# Patient Record
Sex: Female | Born: 1960 | Race: White | Hispanic: No | Marital: Married | Smoking: Heavy tobacco smoker
Health system: Southern US, Community
[De-identification: ages and names within clinical notes are randomized; demographics above are authoritative.]

## PROBLEM LIST (undated history)

## (undated) DIAGNOSIS — T7840XA Allergy, unspecified, initial encounter: Secondary | ICD-10-CM

## (undated) DIAGNOSIS — R51 Headache: Secondary | ICD-10-CM

## (undated) DIAGNOSIS — E785 Hyperlipidemia, unspecified: Secondary | ICD-10-CM

## (undated) DIAGNOSIS — I10 Essential (primary) hypertension: Secondary | ICD-10-CM

## (undated) DIAGNOSIS — R519 Headache, unspecified: Secondary | ICD-10-CM

## (undated) DIAGNOSIS — M1990 Unspecified osteoarthritis, unspecified site: Secondary | ICD-10-CM

## (undated) DIAGNOSIS — J180 Bronchopneumonia, unspecified organism: Secondary | ICD-10-CM

## (undated) HISTORY — PX: ABDOMINAL HYSTERECTOMY: SHX81

## (undated) HISTORY — DX: Bronchopneumonia, unspecified organism: J18.0

## (undated) HISTORY — PX: TONSILLECTOMY AND ADENOIDECTOMY: SUR1326

## (undated) HISTORY — PX: KNEE SURGERY: SHX244

## (undated) HISTORY — PX: JOINT REPLACEMENT: SHX530

## (undated) HISTORY — PX: TONSILLECTOMY: SUR1361

## (undated) HISTORY — DX: Allergy, unspecified, initial encounter: T78.40XA

## (undated) HISTORY — DX: Unspecified osteoarthritis, unspecified site: M19.90

## (undated) HISTORY — DX: Essential (primary) hypertension: I10

## (undated) HISTORY — DX: Hyperlipidemia, unspecified: E78.5

---

## 1998-04-30 ENCOUNTER — Emergency Department (HOSPITAL_COMMUNITY): Admission: EM | Admit: 1998-04-30 | Discharge: 1998-04-30 | Payer: Self-pay | Admitting: *Deleted

## 2004-01-21 ENCOUNTER — Ambulatory Visit: Payer: Self-pay | Admitting: Pain Medicine

## 2004-01-25 ENCOUNTER — Ambulatory Visit: Payer: Self-pay | Admitting: Pain Medicine

## 2004-02-16 ENCOUNTER — Ambulatory Visit: Payer: Self-pay | Admitting: Pain Medicine

## 2004-02-22 ENCOUNTER — Ambulatory Visit: Payer: Self-pay | Admitting: Pain Medicine

## 2004-02-29 ENCOUNTER — Ambulatory Visit: Payer: Self-pay | Admitting: Family Medicine

## 2004-03-15 ENCOUNTER — Ambulatory Visit: Payer: Self-pay | Admitting: Pain Medicine

## 2004-03-30 ENCOUNTER — Ambulatory Visit: Payer: Self-pay | Admitting: Pain Medicine

## 2004-04-12 ENCOUNTER — Ambulatory Visit: Payer: Self-pay | Admitting: Pain Medicine

## 2004-04-25 ENCOUNTER — Ambulatory Visit: Payer: Self-pay | Admitting: Pain Medicine

## 2004-05-31 ENCOUNTER — Ambulatory Visit: Payer: Self-pay | Admitting: Pain Medicine

## 2004-06-06 ENCOUNTER — Ambulatory Visit: Payer: Self-pay | Admitting: Pain Medicine

## 2004-07-04 ENCOUNTER — Ambulatory Visit: Payer: Self-pay | Admitting: Pain Medicine

## 2004-07-20 ENCOUNTER — Ambulatory Visit: Payer: Self-pay | Admitting: Pain Medicine

## 2004-08-30 ENCOUNTER — Ambulatory Visit: Payer: Self-pay | Admitting: Pain Medicine

## 2004-09-05 ENCOUNTER — Ambulatory Visit: Payer: Self-pay | Admitting: Pain Medicine

## 2004-09-27 ENCOUNTER — Ambulatory Visit: Payer: Self-pay | Admitting: Pain Medicine

## 2004-10-05 ENCOUNTER — Ambulatory Visit: Payer: Self-pay | Admitting: Pain Medicine

## 2004-11-10 ENCOUNTER — Ambulatory Visit: Payer: Self-pay | Admitting: Pain Medicine

## 2004-11-14 ENCOUNTER — Ambulatory Visit: Payer: Self-pay | Admitting: Pain Medicine

## 2005-01-05 ENCOUNTER — Ambulatory Visit: Payer: Self-pay | Admitting: Pain Medicine

## 2005-01-11 ENCOUNTER — Ambulatory Visit: Payer: Self-pay | Admitting: Pain Medicine

## 2005-02-02 ENCOUNTER — Ambulatory Visit: Payer: Self-pay | Admitting: Pain Medicine

## 2005-02-08 ENCOUNTER — Ambulatory Visit: Payer: Self-pay | Admitting: Pain Medicine

## 2005-02-28 ENCOUNTER — Ambulatory Visit: Payer: Self-pay | Admitting: Pain Medicine

## 2005-03-06 ENCOUNTER — Ambulatory Visit: Payer: Self-pay | Admitting: Pain Medicine

## 2005-04-27 ENCOUNTER — Ambulatory Visit: Payer: Self-pay | Admitting: Pain Medicine

## 2005-05-03 ENCOUNTER — Ambulatory Visit: Payer: Self-pay | Admitting: Pain Medicine

## 2005-05-23 ENCOUNTER — Ambulatory Visit: Payer: Self-pay | Admitting: Pain Medicine

## 2005-05-29 ENCOUNTER — Ambulatory Visit: Payer: Self-pay | Admitting: Pain Medicine

## 2005-06-20 ENCOUNTER — Ambulatory Visit: Payer: Self-pay | Admitting: Pain Medicine

## 2005-07-06 ENCOUNTER — Ambulatory Visit: Payer: Self-pay | Admitting: Pain Medicine

## 2005-07-24 ENCOUNTER — Ambulatory Visit: Payer: Self-pay | Admitting: Pain Medicine

## 2005-08-17 ENCOUNTER — Ambulatory Visit: Payer: Self-pay | Admitting: Pain Medicine

## 2005-09-25 ENCOUNTER — Ambulatory Visit: Payer: Self-pay | Admitting: Pain Medicine

## 2005-10-24 ENCOUNTER — Ambulatory Visit: Payer: Self-pay | Admitting: Pain Medicine

## 2005-11-01 ENCOUNTER — Ambulatory Visit: Payer: Self-pay | Admitting: Pain Medicine

## 2005-11-21 ENCOUNTER — Ambulatory Visit: Payer: Self-pay | Admitting: Pain Medicine

## 2005-11-27 ENCOUNTER — Ambulatory Visit: Payer: Self-pay | Admitting: Pain Medicine

## 2005-12-07 ENCOUNTER — Ambulatory Visit: Payer: Self-pay | Admitting: Internal Medicine

## 2005-12-18 ENCOUNTER — Ambulatory Visit: Payer: Self-pay | Admitting: Physician Assistant

## 2005-12-21 ENCOUNTER — Ambulatory Visit: Payer: Self-pay | Admitting: Pain Medicine

## 2006-01-05 ENCOUNTER — Ambulatory Visit: Payer: Self-pay | Admitting: Unknown Physician Specialty

## 2006-01-22 ENCOUNTER — Ambulatory Visit: Payer: Self-pay | Admitting: Pain Medicine

## 2006-02-14 ENCOUNTER — Ambulatory Visit: Payer: Self-pay | Admitting: Internal Medicine

## 2006-02-20 ENCOUNTER — Ambulatory Visit: Payer: Self-pay | Admitting: Pain Medicine

## 2006-03-07 ENCOUNTER — Ambulatory Visit: Payer: Self-pay | Admitting: Pain Medicine

## 2006-04-17 ENCOUNTER — Ambulatory Visit: Payer: Self-pay | Admitting: Pain Medicine

## 2006-04-30 ENCOUNTER — Ambulatory Visit: Payer: Self-pay | Admitting: Pain Medicine

## 2006-05-15 ENCOUNTER — Ambulatory Visit: Payer: Self-pay | Admitting: Pain Medicine

## 2006-07-24 ENCOUNTER — Ambulatory Visit: Payer: Self-pay | Admitting: Pain Medicine

## 2006-07-30 ENCOUNTER — Ambulatory Visit: Payer: Self-pay | Admitting: Pain Medicine

## 2006-08-23 ENCOUNTER — Ambulatory Visit: Payer: Self-pay | Admitting: Pain Medicine

## 2006-09-22 ENCOUNTER — Ambulatory Visit: Payer: Self-pay | Admitting: Unknown Physician Specialty

## 2006-09-25 ENCOUNTER — Ambulatory Visit: Payer: Self-pay | Admitting: Pain Medicine

## 2006-10-22 ENCOUNTER — Ambulatory Visit: Payer: Self-pay | Admitting: Pain Medicine

## 2006-10-31 ENCOUNTER — Ambulatory Visit: Payer: Self-pay | Admitting: Unknown Physician Specialty

## 2006-11-01 ENCOUNTER — Ambulatory Visit: Payer: Self-pay | Admitting: Unknown Physician Specialty

## 2006-12-04 ENCOUNTER — Ambulatory Visit: Payer: Self-pay | Admitting: Pain Medicine

## 2007-01-02 ENCOUNTER — Ambulatory Visit: Payer: Self-pay | Admitting: Pain Medicine

## 2007-01-31 ENCOUNTER — Ambulatory Visit: Payer: Self-pay | Admitting: Pain Medicine

## 2007-02-06 ENCOUNTER — Ambulatory Visit: Payer: Self-pay | Admitting: Pain Medicine

## 2007-03-05 ENCOUNTER — Ambulatory Visit: Payer: Self-pay | Admitting: Pain Medicine

## 2007-03-18 ENCOUNTER — Ambulatory Visit: Payer: Self-pay | Admitting: Pain Medicine

## 2007-03-26 ENCOUNTER — Ambulatory Visit: Payer: Self-pay | Admitting: Pain Medicine

## 2007-04-30 ENCOUNTER — Ambulatory Visit: Payer: Self-pay | Admitting: Pain Medicine

## 2007-05-06 ENCOUNTER — Ambulatory Visit: Payer: Self-pay | Admitting: Pain Medicine

## 2007-05-30 ENCOUNTER — Ambulatory Visit: Payer: Self-pay | Admitting: Pain Medicine

## 2007-06-05 ENCOUNTER — Ambulatory Visit: Payer: Self-pay | Admitting: Pain Medicine

## 2007-06-27 ENCOUNTER — Ambulatory Visit: Payer: Self-pay | Admitting: Pain Medicine

## 2007-07-15 ENCOUNTER — Ambulatory Visit: Payer: Self-pay | Admitting: Pain Medicine

## 2007-09-03 ENCOUNTER — Ambulatory Visit: Payer: Self-pay | Admitting: Pain Medicine

## 2007-09-09 ENCOUNTER — Ambulatory Visit: Payer: Self-pay | Admitting: Pain Medicine

## 2007-09-17 ENCOUNTER — Ambulatory Visit: Payer: Self-pay | Admitting: Internal Medicine

## 2007-10-01 ENCOUNTER — Ambulatory Visit: Payer: Self-pay | Admitting: Pain Medicine

## 2007-10-07 ENCOUNTER — Ambulatory Visit: Payer: Self-pay | Admitting: Pain Medicine

## 2007-10-31 ENCOUNTER — Ambulatory Visit: Payer: Self-pay | Admitting: Pain Medicine

## 2007-11-11 ENCOUNTER — Ambulatory Visit: Payer: Self-pay | Admitting: Pain Medicine

## 2007-12-03 ENCOUNTER — Ambulatory Visit: Payer: Self-pay | Admitting: Pain Medicine

## 2007-12-23 ENCOUNTER — Ambulatory Visit: Payer: Self-pay | Admitting: Pain Medicine

## 2008-01-03 ENCOUNTER — Ambulatory Visit: Payer: Self-pay | Admitting: Internal Medicine

## 2008-01-28 ENCOUNTER — Ambulatory Visit: Payer: Self-pay | Admitting: Pain Medicine

## 2008-01-29 ENCOUNTER — Ambulatory Visit: Payer: Self-pay | Admitting: Pain Medicine

## 2008-02-18 ENCOUNTER — Ambulatory Visit: Payer: Self-pay | Admitting: Pain Medicine

## 2008-02-24 ENCOUNTER — Ambulatory Visit: Payer: Self-pay | Admitting: Pain Medicine

## 2008-03-31 ENCOUNTER — Ambulatory Visit: Payer: Self-pay | Admitting: Pain Medicine

## 2008-04-20 ENCOUNTER — Ambulatory Visit: Payer: Self-pay | Admitting: General Surgery

## 2008-04-27 ENCOUNTER — Ambulatory Visit: Payer: Self-pay | Admitting: Pain Medicine

## 2008-05-26 ENCOUNTER — Ambulatory Visit: Payer: Self-pay | Admitting: Pain Medicine

## 2008-06-03 ENCOUNTER — Ambulatory Visit: Payer: Self-pay | Admitting: Pain Medicine

## 2008-06-16 ENCOUNTER — Ambulatory Visit: Payer: Self-pay | Admitting: Internal Medicine

## 2008-06-22 ENCOUNTER — Ambulatory Visit: Payer: Self-pay | Admitting: Pain Medicine

## 2008-07-29 ENCOUNTER — Ambulatory Visit: Payer: Self-pay | Admitting: Pain Medicine

## 2008-08-24 ENCOUNTER — Ambulatory Visit: Payer: Self-pay | Admitting: Pain Medicine

## 2008-09-17 ENCOUNTER — Ambulatory Visit: Payer: Self-pay | Admitting: Pain Medicine

## 2008-10-20 ENCOUNTER — Ambulatory Visit: Payer: Self-pay | Admitting: Pain Medicine

## 2008-10-28 ENCOUNTER — Ambulatory Visit: Payer: Self-pay | Admitting: Pain Medicine

## 2008-11-18 ENCOUNTER — Ambulatory Visit: Payer: Self-pay | Admitting: Pain Medicine

## 2008-12-29 ENCOUNTER — Ambulatory Visit: Payer: Self-pay | Admitting: Pain Medicine

## 2009-01-04 ENCOUNTER — Ambulatory Visit: Payer: Self-pay | Admitting: Pain Medicine

## 2009-01-27 ENCOUNTER — Ambulatory Visit: Payer: Self-pay | Admitting: Pain Medicine

## 2009-02-22 ENCOUNTER — Ambulatory Visit: Payer: Self-pay | Admitting: Pain Medicine

## 2009-04-01 ENCOUNTER — Ambulatory Visit: Payer: Self-pay | Admitting: Pain Medicine

## 2009-05-11 ENCOUNTER — Ambulatory Visit: Payer: Self-pay | Admitting: Pain Medicine

## 2009-06-10 ENCOUNTER — Ambulatory Visit: Payer: Self-pay | Admitting: Pain Medicine

## 2009-06-21 ENCOUNTER — Ambulatory Visit: Payer: Self-pay | Admitting: Pain Medicine

## 2009-07-15 ENCOUNTER — Ambulatory Visit: Payer: Self-pay | Admitting: Pain Medicine

## 2009-08-19 ENCOUNTER — Ambulatory Visit: Payer: Self-pay | Admitting: Internal Medicine

## 2009-08-24 ENCOUNTER — Ambulatory Visit: Payer: Self-pay | Admitting: Pain Medicine

## 2009-09-13 ENCOUNTER — Ambulatory Visit: Payer: Self-pay | Admitting: Pain Medicine

## 2009-10-12 ENCOUNTER — Ambulatory Visit: Payer: Self-pay | Admitting: Pain Medicine

## 2009-10-20 ENCOUNTER — Ambulatory Visit: Payer: Self-pay | Admitting: Pain Medicine

## 2009-11-08 ENCOUNTER — Ambulatory Visit: Payer: Self-pay | Admitting: Pain Medicine

## 2009-11-22 ENCOUNTER — Ambulatory Visit: Payer: Self-pay | Admitting: Pain Medicine

## 2009-12-14 ENCOUNTER — Ambulatory Visit: Payer: Self-pay | Admitting: Pain Medicine

## 2010-01-18 ENCOUNTER — Ambulatory Visit: Payer: Self-pay | Admitting: Pain Medicine

## 2010-01-24 ENCOUNTER — Ambulatory Visit: Payer: Self-pay | Admitting: Pain Medicine

## 2010-02-10 ENCOUNTER — Ambulatory Visit: Payer: Self-pay | Admitting: Pain Medicine

## 2010-03-14 ENCOUNTER — Ambulatory Visit: Payer: Self-pay | Admitting: Pain Medicine

## 2010-03-21 ENCOUNTER — Ambulatory Visit: Payer: Self-pay | Admitting: Pain Medicine

## 2010-04-12 ENCOUNTER — Ambulatory Visit: Payer: Self-pay | Admitting: Pain Medicine

## 2010-05-12 ENCOUNTER — Ambulatory Visit: Payer: Self-pay | Admitting: Pain Medicine

## 2010-05-25 ENCOUNTER — Ambulatory Visit: Payer: Self-pay | Admitting: Pain Medicine

## 2010-06-09 ENCOUNTER — Ambulatory Visit: Payer: Self-pay | Admitting: Pain Medicine

## 2010-07-07 ENCOUNTER — Ambulatory Visit: Payer: Self-pay | Admitting: Pain Medicine

## 2010-08-15 ENCOUNTER — Ambulatory Visit: Payer: Self-pay | Admitting: Pain Medicine

## 2010-09-13 ENCOUNTER — Ambulatory Visit: Payer: Self-pay | Admitting: Pain Medicine

## 2010-10-17 ENCOUNTER — Ambulatory Visit: Payer: Self-pay | Admitting: Pain Medicine

## 2010-11-15 ENCOUNTER — Ambulatory Visit: Payer: Self-pay | Admitting: Pain Medicine

## 2010-12-01 ENCOUNTER — Ambulatory Visit: Payer: Self-pay | Admitting: Internal Medicine

## 2010-12-13 ENCOUNTER — Ambulatory Visit: Payer: Self-pay | Admitting: Pain Medicine

## 2010-12-28 ENCOUNTER — Ambulatory Visit: Payer: Self-pay | Admitting: Pain Medicine

## 2011-01-17 ENCOUNTER — Ambulatory Visit: Payer: Self-pay | Admitting: Pain Medicine

## 2011-02-14 ENCOUNTER — Ambulatory Visit: Payer: Self-pay | Admitting: Pain Medicine

## 2011-03-13 ENCOUNTER — Ambulatory Visit: Payer: Self-pay | Admitting: Pain Medicine

## 2011-04-13 ENCOUNTER — Ambulatory Visit: Payer: Self-pay | Admitting: Pain Medicine

## 2011-04-26 ENCOUNTER — Ambulatory Visit: Payer: Self-pay | Admitting: Pain Medicine

## 2011-05-16 ENCOUNTER — Ambulatory Visit: Payer: Self-pay | Admitting: Pain Medicine

## 2011-06-13 ENCOUNTER — Ambulatory Visit: Payer: Self-pay | Admitting: Pain Medicine

## 2011-07-10 ENCOUNTER — Ambulatory Visit: Payer: Self-pay | Admitting: Pain Medicine

## 2011-08-10 ENCOUNTER — Ambulatory Visit: Payer: Self-pay | Admitting: Pain Medicine

## 2011-08-30 ENCOUNTER — Ambulatory Visit: Payer: Self-pay | Admitting: Pain Medicine

## 2011-09-12 ENCOUNTER — Ambulatory Visit: Payer: Self-pay | Admitting: Pain Medicine

## 2011-10-12 ENCOUNTER — Ambulatory Visit: Payer: Self-pay | Admitting: Pain Medicine

## 2011-11-01 ENCOUNTER — Ambulatory Visit: Payer: Self-pay | Admitting: Pain Medicine

## 2011-12-12 ENCOUNTER — Ambulatory Visit: Payer: Self-pay | Admitting: Pain Medicine

## 2012-01-11 ENCOUNTER — Ambulatory Visit: Payer: Self-pay | Admitting: Pain Medicine

## 2012-01-17 ENCOUNTER — Ambulatory Visit: Payer: Self-pay | Admitting: Pain Medicine

## 2012-02-08 ENCOUNTER — Ambulatory Visit: Payer: Self-pay | Admitting: Pain Medicine

## 2012-02-14 ENCOUNTER — Ambulatory Visit: Payer: Self-pay | Admitting: Pain Medicine

## 2012-03-12 ENCOUNTER — Ambulatory Visit: Payer: Self-pay | Admitting: Pain Medicine

## 2012-03-20 ENCOUNTER — Ambulatory Visit: Payer: Self-pay | Admitting: Pain Medicine

## 2012-04-11 ENCOUNTER — Ambulatory Visit: Payer: Self-pay | Admitting: Pain Medicine

## 2012-05-14 ENCOUNTER — Ambulatory Visit: Payer: Self-pay | Admitting: Pain Medicine

## 2012-05-22 ENCOUNTER — Ambulatory Visit: Payer: Self-pay | Admitting: Pain Medicine

## 2012-06-11 ENCOUNTER — Ambulatory Visit: Payer: Self-pay | Admitting: Pain Medicine

## 2012-07-11 ENCOUNTER — Ambulatory Visit: Payer: Self-pay | Admitting: Pain Medicine

## 2012-07-22 ENCOUNTER — Ambulatory Visit: Payer: Self-pay | Admitting: Pain Medicine

## 2012-08-12 ENCOUNTER — Ambulatory Visit: Payer: Self-pay | Admitting: Pain Medicine

## 2012-09-09 ENCOUNTER — Ambulatory Visit: Payer: Self-pay | Admitting: Pain Medicine

## 2012-10-10 ENCOUNTER — Ambulatory Visit: Payer: Self-pay | Admitting: Pain Medicine

## 2012-11-06 ENCOUNTER — Ambulatory Visit: Payer: Self-pay | Admitting: Pain Medicine

## 2012-12-05 ENCOUNTER — Ambulatory Visit: Payer: Self-pay | Admitting: Pain Medicine

## 2013-01-06 ENCOUNTER — Ambulatory Visit: Payer: Self-pay | Admitting: Pain Medicine

## 2013-02-04 ENCOUNTER — Ambulatory Visit: Payer: Self-pay | Admitting: Pain Medicine

## 2013-03-05 ENCOUNTER — Ambulatory Visit: Payer: Self-pay | Admitting: Pain Medicine

## 2013-04-01 ENCOUNTER — Ambulatory Visit: Payer: Self-pay | Admitting: Family

## 2013-04-08 ENCOUNTER — Ambulatory Visit: Payer: Self-pay | Admitting: Pain Medicine

## 2013-05-07 ENCOUNTER — Ambulatory Visit: Payer: Self-pay | Admitting: Pain Medicine

## 2013-06-05 ENCOUNTER — Ambulatory Visit: Payer: Self-pay | Admitting: Pain Medicine

## 2013-06-09 ENCOUNTER — Ambulatory Visit: Payer: Self-pay | Admitting: Pain Medicine

## 2013-07-08 ENCOUNTER — Ambulatory Visit: Payer: Self-pay | Admitting: Pain Medicine

## 2013-08-07 ENCOUNTER — Ambulatory Visit: Payer: Self-pay | Admitting: Pain Medicine

## 2013-09-03 ENCOUNTER — Ambulatory Visit: Payer: Self-pay | Admitting: Pain Medicine

## 2013-10-01 ENCOUNTER — Ambulatory Visit: Payer: Self-pay | Admitting: Pain Medicine

## 2013-11-05 ENCOUNTER — Ambulatory Visit: Payer: Self-pay | Admitting: Pain Medicine

## 2013-12-02 ENCOUNTER — Ambulatory Visit: Payer: Self-pay | Admitting: Pain Medicine

## 2013-12-22 ENCOUNTER — Ambulatory Visit: Payer: Self-pay | Admitting: Pain Medicine

## 2013-12-26 ENCOUNTER — Ambulatory Visit: Payer: Self-pay | Admitting: Orthopedic Surgery

## 2014-01-20 ENCOUNTER — Ambulatory Visit: Payer: Self-pay | Admitting: Pain Medicine

## 2014-01-21 ENCOUNTER — Ambulatory Visit: Payer: Self-pay | Admitting: Pain Medicine

## 2014-02-04 ENCOUNTER — Ambulatory Visit: Payer: Self-pay | Admitting: Pain Medicine

## 2014-04-13 ENCOUNTER — Ambulatory Visit: Payer: Self-pay | Admitting: Pain Medicine

## 2014-04-27 ENCOUNTER — Ambulatory Visit: Payer: Self-pay | Admitting: Pain Medicine

## 2014-05-12 ENCOUNTER — Ambulatory Visit: Payer: Self-pay | Admitting: Pain Medicine

## 2014-06-08 ENCOUNTER — Ambulatory Visit: Payer: Self-pay | Admitting: Pain Medicine

## 2014-07-08 ENCOUNTER — Ambulatory Visit
Admit: 2014-07-08 | Disposition: A | Discharge status: Home / Self Care | Payer: Self-pay | Attending: Pain Medicine | Admitting: Pain Medicine

## 2014-08-03 ENCOUNTER — Encounter: Payer: Self-pay | Admitting: Pain Medicine

## 2014-08-03 ENCOUNTER — Telehealth: Payer: Self-pay

## 2014-08-03 ENCOUNTER — Ambulatory Visit: Payer: Managed Care, Other (non HMO) | Attending: Pain Medicine | Admitting: Pain Medicine

## 2014-08-03 VITALS — BP 116/51 | HR 68 | Temp 97.9°F | Resp 16 | Ht 63.0 in | Wt 165.0 lb

## 2014-08-03 DIAGNOSIS — M25561 Pain in right knee: Secondary | ICD-10-CM | POA: Diagnosis not present

## 2014-08-03 DIAGNOSIS — M25562 Pain in left knee: Secondary | ICD-10-CM | POA: Diagnosis not present

## 2014-08-03 DIAGNOSIS — G9050 Complex regional pain syndrome I, unspecified: Secondary | ICD-10-CM | POA: Diagnosis not present

## 2014-08-03 DIAGNOSIS — M5416 Radiculopathy, lumbar region: Secondary | ICD-10-CM

## 2014-08-03 DIAGNOSIS — M545 Low back pain, unspecified: Secondary | ICD-10-CM

## 2014-08-03 DIAGNOSIS — Z96653 Presence of artificial knee joint, bilateral: Secondary | ICD-10-CM | POA: Diagnosis not present

## 2014-08-03 MED ORDER — MIDAZOLAM HCL 2 MG/2ML IJ SOLN: 5.0000 mg | Freq: Once | INTRAMUSCULAR | Status: DC

## 2014-08-03 MED ORDER — LACTATED RINGERS IV SOLN
1000.0000 mL | INTRAVENOUS | Status: DC
  Administered 2014-08-03: 1000 mL via INTRAVENOUS

## 2014-08-03 MED ORDER — GABAPENTIN 300 MG PO CAPS: 300.0000 mg | ORAL_CAPSULE | Freq: Two times a day (BID) | ORAL | Status: DC

## 2014-08-03 MED ORDER — FENTANYL CITRATE (PF) 100 MCG/2ML IJ SOLN
50.0000 ug | Freq: Once | INTRAMUSCULAR | Status: AC
  Administered 2014-08-03: 50 ug via INTRAVENOUS

## 2014-08-03 MED ORDER — TRIAMCINOLONE ACETONIDE 40 MG/ML IJ SUSP
40.0000 mg | Freq: Once | INTRAMUSCULAR | Status: AC
Start: 2014-08-03 — End: 2014-08-03
  Administered 2014-08-03: 40 mg via INTRAMUSCULAR

## 2014-08-03 MED ORDER — CEFUROXIME AXETIL 250 MG PO TABS: 250.0000 mg | ORAL_TABLET | Freq: Two times a day (BID) | ORAL | Status: DC

## 2014-08-03 MED ORDER — HYDROCODONE-ACETAMINOPHEN 5-325 MG PO TABS: 1.0000 | ORAL_TABLET | Freq: Four times a day (QID) | ORAL | Status: DC | PRN

## 2014-08-03 MED ORDER — BUPIVACAINE HCL (PF) 0.25 % IJ SOLN
30.0000 mL | Freq: Once | INTRAMUSCULAR | Status: AC
  Administered 2014-08-03: 30 mL

## 2014-08-03 MED ORDER — CEFAZOLIN SODIUM 1-5 GM-% IV SOLN
1.0000 g | Freq: Once | INTRAVENOUS | Status: AC
  Administered 2014-08-03: 1 g via INTRAVENOUS

## 2014-08-03 MED ORDER — ORPHENADRINE CITRATE 30 MG/ML IJ SOLN
60.0000 mg | Freq: Once | INTRAMUSCULAR | Status: AC
  Administered 2014-08-03: 60 mg via INTRAMUSCULAR

## 2014-08-03 NOTE — Progress Notes (Signed)
Subjective:     Patient ID: Michelle Conway, female   DOB: 1960/09/10, 54 y.o.   MRN: [id number]  Back Pain This is a chronic problem. The current episode started more than 1 year ago. The problem occurs constantly. The problem has been gradually improving since onset. The pain is present in the lumbar spine. The quality of the pain is described as aching and shooting. The pain radiates to the left knee (RADIATES  TO LEFT AND RIGHT KNEES). Associated symptoms include leg pain, numbness, tingling and weakness. Risk factors include obesity. She has tried NSAIDs, muscle relaxant, heat, analgesics and home exercises for the symptoms. The treatment provided significant relief.  Knee Pain  The incident occurred more than 1 week ago (SEVERAL YEARS). There was no injury mechanism. The pain is present in the right leg, left leg, left knee and right knee. The quality of the pain is described as aching, burning, shooting and stabbing. Associated symptoms include an inability to bear weight, a loss of motion, muscle weakness, numbness and tingling. She reports no foreign bodies present. The symptoms are aggravated by movement, palpation and weight bearing. She has tried elevation, heat, ice, NSAIDs and rest for the symptoms. The treatment provided significant relief.  Leg Pain  The incident occurred more than 1 week ago. There was no injury mechanism. The pain is present in the left leg, right leg, right knee and left knee. The quality of the pain is described as aching, burning, shooting and stabbing. Associated symptoms include an inability to bear weight, a loss of motion, muscle weakness, numbness and tingling. She reports no foreign bodies present. The symptoms are aggravated by palpation, movement and weight bearing. The treatment provided significant relief.     Review of Systems  Musculoskeletal: Positive for back pain.  Neurological: Positive for tingling, weakness and numbness.       Objective:   Physical Exam  Musculoskeletal: She exhibits edema and tenderness.       Right knee: Tenderness found.       Left knee: Tenderness found.       Lumbar back: She exhibits tenderness.   Severe pain of both knees   Low back pain, lower extremity pain  With severe  Knee pain      Assessment:     Complex Regional Pain Syndrome  Total Knee Replacement Low back pain  Lower extremity pain     Plan:

## 2014-08-03 NOTE — Progress Notes (Signed)
Discharged by wheelchair to home.    Teach back 3 done by Evon Slack RN.Marland Kitchen Assisted into car by Charna Busman due to numbness in legs. IV catheter intact.  Pressure dressing applied by P. Florene Glen, RN

## 2014-08-03 NOTE — Procedures (Signed)
PROCEDURE PERFORMED: Lumbosacral selective nerve root block   NOTE: The patient is a 54 y.o. year-old female who returns to [place] for further evaluation and treatment of pain involving the lumbar and lower extremity region. 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 The risks, benefits, and expectations of the procedure have been explained to the patient who was understanding and in agreement with suggested treatment plan. We will proceed with interventional treatment as discussed and as explained to the patient. The patient is understanding and in agreement with suggested treatment plan.   DESCRIPTION OF PROCEDURE: Lumbosacral selective nerve root block with IV Versed, IV fentanyl conscious sedation, EKG, blood pressure, pulse, and pulse oximetry monitoring. The procedure was performed with the patient in the prone position under fluoroscopic guidance. With the patient in the prone position, Betadine prep of proposed entry site was performed. Local anesthetic skin wheal of proposed needle entry site was prepared with 1.5% plain lidocaine with AP view of the lumbosacral spine.   PROCEDURE #1: Needle placement at the L3 vertebral body: A 22-gauge needle was inserted at the inferior border of the transverse process of the vertebral body with needle placed medial to the midline of the transverse process on AP view of the lumbosacral spine.  PROCEDURE #2: Needle placement at the L4 vertebral body: A 22-gauge needle was inserted at the inferior border of the transverse process of the vertebral body with needle placed medial to the midline of the transverse process on AP view of the lumbosacral spine.    PROCEDURE #3: Needle placement at the L5 vertebral body:  A22-gauge needle was inserted at the inferior border of the transverse process of the vertebral body with needle placed medial to the midline of the transverse process on AP view of the lumbosacral spine.   PROCEDURE #4: Needle placement at the S1 foramen. With the patient in the prone position with Betadine prep of proposed entry site accomplished, the S1 foramen was visualized under fluoroscopic guidance with AP view of the lumbosacral spine with cephalad orientation of the fluoroscope with local anesthetic skin wheal of 1.5% lidocaine of proposed needle entry site prepared. A 22-gauge needle was inserted S1 foramen under fluoroscopic guidance eliciting paresthesias radiating from the buttocks to the lower extremity after which needle was slightly withdrawn.   Needle placement was then verified on lateral view at all levels with needle tip documented to be in the posterior superior quadrant of the intervertebral foramen of  L 2, L 3 L 4, L 5 and needle tip documented at the level of the S1 foramen. Following negative aspiration for heme and CSF at each level, each level was injected with 54mL of 0.25% bupivacaine with Kenalog. The patient tolerated the procedure well. A total of 10 mg of Kenalog was utilized for the procedure.   PLAN:  1. Medications: Will continue presently prescribed medications. 2. The patient is to undergo follow-up evaluation with Dr. Lavera Guise for evaluation of blood pressure and general medical condition status post procedure performed on today's visit. 3. Surgical follow-up evaluation. 4. Neurological evaluation. 5. May consider radiofrequency procedures, implantation type procedures and other treatment pending response to treatment and follow-up evaluation. 6. The patient has been advise do adhere to proper body mechanics  and avoid activities which may aggravate condition. 7. The patient has been advised to call the [facility] prior to scheduled return appointment  should there be significant change in the patient's condition or should the patient have other concerns regarding condition prior to scheduled return appointment.

## 2014-08-03 NOTE — Patient Instructions (Addendum)
Selective Nerve Root Block Patient Information   Description: Specific nerve roots exit the spinal canal and these nerves can be compressed and inflamed by a bulging disc and bone spurs.  By injecting steroids on the nerve root, we can potentially decrease the inflammation surrounding these nerves, which often leads to decreased pain.  Also, by injecting local anesthesia on the nerve root, this can provide [place] helpful information to give to your referring doctor if it decreases your pain.  Selective nerve root blocks can be done along the spine from the neck to the low back depending on the location of your pain.   After numbing the skin with local anesthesia, a small needle is passed to the nerve root and the position of the needle is verified using x-ray pictures.  After the needle is in correct position, we then deposit the medication.  You may experience a pressure sensation while this is being done.  The entire block usually lasts less than 15 minutes.  Conditions that may be treated with selective nerve root blocks:  Low back and leg pain  Spinal stenosis  Diagnostic block prior to potential surgery  Neck and arm pain  Post laminectomy syndrome  Preparation for the injection:  1. Do not eat any solid food or dairy products within 6 hours of your appointment. 2. You may drink clear liquids up to 2 hours before an appointment.  Clear liquids include water, black coffee, juice or soda.  No milk or cream please. 3. You may take your regular medications, including pain medications, with a sip of water before your appointment.  Diabetics should hold regular insulin (if taken separately) and take 1/2 normal NPH dose the morning of the procedure.  Carry some sugar containing items with you to your appointment. 4. A driver must accompany you and be prepared to drive you home after your procedure. 5. Bring all your current medications with you. 6. An IV may be inserted and sedation may be given  at the discretion of the physician. 7. A blood pressure cuff, EKG, and other monitors will often be applied during the procedure.  Some patients may need to have extra oxygen administered for a short period. 8. You will be asked to provide medical information, including allergies, prior to the procedure.  We must know immediately if you are taking blood  Thinners (like Coumadin) or if you are allergic to IV iodine contrast (dye).  Possible side-effects: All are usually temporary  Bleeding from needle site  Light headedness  Numbness and tingling  Decreased blood pressure  Weakness in arms/legs  Pressure sensation in back/neck  Pain at injection site (several days)  Possible complications: All are extremely rare  Infection  Nerve injury  Spinal headache (a headache wore with upright position)  Call if you experience:  Fever/chills associated with headache or increased back/neck pain  Headache worsened by an upright position  New onset weakness or numbness of an extremity below the injection site  Hives or difficulty breathing (go to the emergency room)  Inflammation or drainage at the injection site(s)  Severe back/neck pain greater than usual  New symptoms which are concerning to you  Do not drive for 24 hours  Use ice today 15 minutes on (do not apply directly to your skin), 15 minutes off for 24 hours if needed, then heat tomorrow if needed.   Do not make any legal decisions, or sign any legal papers for 24 hours  No alcohol for 24 hours  Teach back 3 things. Patient discharged via wheelchair at  1125hrs accompanied by Alphonse Guild and Merton Border, RN. Patient discharged home. Please note:  Although the local anesthetic injected can often make your back or neck feel good for several hours after the injection the pain will likely return.  It takes 3-5 days for steroids to work on the nerve root. You may not notice any pain relief for at least one week.  If  effective, we will often do a series of 3 injections spaced 3-6 weeks apart to maximally decrease your pain.    If you have any questions, please call [phone] [facility]

## 2014-08-03 NOTE — Progress Notes (Signed)
   Subjective:    Patient ID: Michelle Conway, female    DOB: Jun 03, 1960, 54 y.o.   MRN: [id number]  HPI    Review of Systems     Objective:   Physical Exam        Assessment & Plan:

## 2014-08-03 NOTE — Progress Notes (Signed)
   Subjective:    Patient ID: Michelle Conway, female    DOB: Jan 29, 1961, 54 y.o.   MRN: [id number]  HPI    Review of Systems     Objective:   Physical Exam        Assessment & Plan:

## 2014-08-04 ENCOUNTER — Telehealth: Payer: Self-pay | Admitting: *Deleted

## 2014-08-04 NOTE — Telephone Encounter (Signed)
Message left

## 2014-08-10 NOTE — Addendum Note (Signed)
Addended by: Morley Kos on: 08/10/2014 05:10 PM   Modules accepted: Level of Service

## 2014-09-03 ENCOUNTER — Encounter: Payer: Managed Care, Other (non HMO) | Admitting: Pain Medicine

## 2014-09-06 ENCOUNTER — Other Ambulatory Visit: Payer: Self-pay | Admitting: Pain Medicine

## 2014-09-06 DIAGNOSIS — M545 Low back pain, unspecified: Secondary | ICD-10-CM

## 2014-09-06 DIAGNOSIS — M5416 Radiculopathy, lumbar region: Secondary | ICD-10-CM

## 2014-09-06 DIAGNOSIS — Z96653 Presence of artificial knee joint, bilateral: Secondary | ICD-10-CM

## 2014-09-06 DIAGNOSIS — Z96659 Presence of unspecified artificial knee joint: Secondary | ICD-10-CM | POA: Insufficient documentation

## 2014-09-06 DIAGNOSIS — G9050 Complex regional pain syndrome I, unspecified: Secondary | ICD-10-CM

## 2014-09-08 ENCOUNTER — Ambulatory Visit: Payer: Managed Care, Other (non HMO) | Attending: Pain Medicine | Admitting: Pain Medicine

## 2014-09-08 ENCOUNTER — Encounter: Payer: Self-pay | Admitting: Pain Medicine

## 2014-09-08 VITALS — BP 125/59 | HR 75 | Temp 98.1°F | Resp 16 | Ht 63.0 in | Wt 165.0 lb

## 2014-09-08 DIAGNOSIS — M545 Low back pain, unspecified: Secondary | ICD-10-CM

## 2014-09-08 DIAGNOSIS — Z96653 Presence of artificial knee joint, bilateral: Secondary | ICD-10-CM | POA: Insufficient documentation

## 2014-09-08 DIAGNOSIS — G90522 Complex regional pain syndrome I of left lower limb: Secondary | ICD-10-CM | POA: Insufficient documentation

## 2014-09-08 DIAGNOSIS — G9050 Complex regional pain syndrome I, unspecified: Secondary | ICD-10-CM

## 2014-09-08 DIAGNOSIS — M7060 Trochanteric bursitis, unspecified hip: Secondary | ICD-10-CM | POA: Diagnosis not present

## 2014-09-08 DIAGNOSIS — M79605 Pain in left leg: Secondary | ICD-10-CM | POA: Diagnosis present

## 2014-09-08 DIAGNOSIS — M5136 Other intervertebral disc degeneration, lumbar region: Secondary | ICD-10-CM | POA: Diagnosis not present

## 2014-09-08 DIAGNOSIS — M5416 Radiculopathy, lumbar region: Secondary | ICD-10-CM

## 2014-09-08 DIAGNOSIS — M79604 Pain in right leg: Secondary | ICD-10-CM | POA: Diagnosis present

## 2014-09-08 DIAGNOSIS — M47816 Spondylosis without myelopathy or radiculopathy, lumbar region: Secondary | ICD-10-CM | POA: Diagnosis not present

## 2014-09-08 MED ORDER — GABAPENTIN 300 MG PO CAPS
ORAL_CAPSULE | ORAL | Status: DC
Start: 2014-09-08 — End: 2014-10-07

## 2014-09-08 MED ORDER — HYDROCODONE-ACETAMINOPHEN 5-325 MG PO TABS: ORAL_TABLET | ORAL | Status: DC

## 2014-09-08 NOTE — Patient Instructions (Addendum)
Continue present medications.   Lumbar selective nerve root block 10/07/2014  F/U PCP for evaliation of  BP and general medical  condition.  F/U surgical evaluation.  F/U neurological evaluation.  May consider radiofrequency rhizolysis or intraspinal procedures pending response to present treatment and F/U evaluation.  Patient to call [facility] should patient have concerns prior to scheduled return appointment. GENERAL RISKS AND COMPLICATIONS  What are the risk, side effects and possible complications? Generally speaking, most procedures are safe.  However, with any procedure there are risks, side effects, and the possibility of complications.  The risks and complications are dependent upon the sites that are lesioned, or the type of nerve block to be performed.  The closer the procedure is to the spine, the more serious the risks are.  Great care is taken when placing the radio frequency needles, block needles or lesioning probes, but sometimes complications can occur. 1. Infection: Any time there is an injection through the skin, there is a risk of infection.  This is why sterile conditions are used for these blocks.  There are four possible types of infection. 1. Localized skin infection. 2. Central Nervous System Infection-This can be in the form of Meningitis, which can be deadly. 3. Epidural Infections-This can be in the form of an epidural abscess, which can cause pressure inside of the spine, causing compression of the spinal cord with subsequent paralysis. This would require an emergency surgery to decompress, and there are no guarantees that the patient would recover from the paralysis. 4. Discitis-This is an infection of the intervertebral discs.  It occurs in about 1% of discography procedures.  It is difficult to treat and it may lead to surgery.        2. Pain: the needles have to go through skin and soft tissues, will cause soreness.       3. Damage to internal  structures:  The nerves to be lesioned may be near blood vessels or    other nerves which can be potentially damaged.       4. Bleeding: Bleeding is more common if the patient is taking blood thinners such as  aspirin, Coumadin, Ticiid, Plavix, etc., or if he/she have some genetic predisposition  such as hemophilia. Bleeding into the spinal canal can cause compression of the spinal  cord with subsequent paralysis.  This would require an emergency surgery to  decompress and there are no guarantees that the patient would recover from the  paralysis.       5. Pneumothorax:  Puncturing of a lung is a possibility, every time a needle is introduced in  the area of the chest or upper back.  Pneumothorax refers to free air around the  collapsed lung(s), inside of the thoracic cavity (chest cavity).  Another two possible  complications related to a similar event would include: Hemothorax and Chylothorax.   These are variations of the Pneumothorax, where instead of air around the collapsed  lung(s), you may have blood or chyle, respectively.       6. Spinal headaches: They may occur with any procedures in the area of the spine.       7. Persistent CSF (Cerebro-Spinal Fluid) leakage: This is a rare problem, but may occur  with prolonged intrathecal or epidural catheters either due to the formation of a fistulous  track or a dural tear.       8. Nerve damage: By working so close to the spinal cord, there is always a  possibility of  nerve damage, which could be as serious as a permanent spinal cord injury with  paralysis.       9. Death:  Although rare, severe deadly allergic reactions known as "Anaphylactic  reaction" can occur to any of the medications used.      10. Worsening of the symptoms:  We can always make thing worse.  What are the chances of something like this happening? Chances of any of this occuring are extremely low.  By statistics, you have more of a chance of getting killed in a motor vehicle  accident: while driving to the hospital than any of the above occurring .  Nevertheless, you should be aware that they are possibilities.  In general, it is similar to taking a shower.  Everybody knows that you can slip, hit your head and get killed.  Does that mean that you should not shower again?  Nevertheless always keep in mind that statistics do not mean anything if you happen to be on the wrong side of them.  Even if a procedure has a 1 (one) in a 1,000,000 (million) chance of going wrong, it you happen to be that one..Also, keep in mind that by statistics, you have more of a chance of having something go wrong when taking medications.  Who should not have this procedure? If you are on a blood thinning medication (e.g. Coumadin, Plavix, see list of "Blood Thinners"), or if you have an active infection going on, you should not have the procedure.  If you are taking any blood thinners, please inform your physician.  How should I prepare for this procedure?  Do not eat or drink anything at least six hours prior to the procedure.  Bring a driver with you .  It cannot be a taxi.  Come accompanied by an adult that can drive you back, and that is strong enough to help you if your legs get weak or numb from the local anesthetic.  Take all of your medicines the morning of the procedure with just enough water to swallow them.  If you have diabetes, make sure that you are scheduled to have your procedure done first thing in the morning, whenever possible.  If you have diabetes, take only half of your insulin dose and notify our nurse that you have done so as soon as you arrive at the clinic.  If you are diabetic, but only take blood sugar pills (oral hypoglycemic), then do not take them on the morning of your procedure.  You may take them after you have had the procedure.  Do not take aspirin or any aspirin-containing medications, at least eleven (11) days prior to the procedure.  They may prolong  bleeding.  Wear loose fitting clothing that may be easy to take off and that you would not mind if it got stained with Betadine or blood.  Do not wear any jewelry or perfume  Remove any nail coloring.  It will interfere with some of our monitoring equipment.  NOTE: Remember that this is not meant to be interpreted as a complete list of all possible complications.  Unforeseen problems may occur.  BLOOD THINNERS The following drugs contain aspirin or other products, which can cause increased bleeding during surgery and should not be taken for 2 weeks prior to and 1 week after surgery.  If you should need take something for relief of minor pain, you may take acetaminophen which is found in Tylenol,m Datril, Anacin-3 and Panadol. It  is not blood thinner. The products listed below are.  Do not take any of the products listed below in addition to any listed on your instruction sheet.  A.P.C or A.P.C with Codeine Codeine Phosphate Capsules #3 Ibuprofen Ridaura  ABC compound Congesprin Imuran rimadil  Advil Cope Indocin Robaxisal  Alka-Seltzer Effervescent Pain Reliever and Antacid Coricidin or Coricidin-D  Indomethacin Rufen  Alka-Seltzer plus Cold Medicine Cosprin Ketoprofen S-A-C Tablets  Anacin Analgesic Tablets or Capsules Coumadin Korlgesic Salflex  Anacin Extra Strength Analgesic tablets or capsules CP-2 Tablets Lanoril Salicylate  Anaprox Cuprimine Capsules Levenox Salocol  Anexsia-D Dalteparin Magan Salsalate  Anodynos Darvon compound Magnesium Salicylate Sine-off  Ansaid Dasin Capsules Magsal Sodium Salicylate  Anturane Depen Capsules Marnal Soma  APF Arthritis pain formula Dewitt's Pills Measurin Stanback  Argesic Dia-Gesic Meclofenamic Sulfinpyrazone  Arthritis Bayer Timed Release Aspirin Diclofenac Meclomen Sulindac  Arthritis pain formula Anacin Dicumarol Medipren Supac  Analgesic (Safety coated) Arthralgen Diffunasal Mefanamic Suprofen  Arthritis Strength Bufferin Dihydrocodeine  Mepro Compound Suprol  Arthropan liquid Dopirydamole Methcarbomol with Aspirin Synalgos  ASA tablets/Enseals Disalcid Micrainin Tagament  Ascriptin Doan's Midol Talwin  Ascriptin A/D Dolene Mobidin Tanderil  Ascriptin Extra Strength Dolobid Moblgesic Ticlid  Ascriptin with Codeine Doloprin or Doloprin with Codeine Momentum Tolectin  Asperbuf Duoprin Mono-gesic Trendar  Aspergum Duradyne Motrin or Motrin IB Triminicin  Aspirin plain, buffered or enteric coated Durasal Myochrisine Trigesic  Aspirin Suppositories Easprin Nalfon Trillsate  Aspirin with Codeine Ecotrin Regular or Extra Strength Naprosyn Uracel  Atromid-S Efficin Naproxen Ursinus  Auranofin Capsules Elmiron Neocylate Vanquish  Axotal Emagrin Norgesic Verin  Azathioprine Empirin or Empirin with Codeine Normiflo Vitamin E  Azolid Emprazil Nuprin Voltaren  Bayer Aspirin plain, buffered or children's or timed BC Tablets or powders Encaprin Orgaran Warfarin Sodium  Buff-a-Comp Enoxaparin Orudis Zorpin  Buff-a-Comp with Codeine Equegesic Os-Cal-Gesic   Buffaprin Excedrin plain, buffered or Extra Strength Oxalid   Bufferin Arthritis Strength Feldene Oxphenbutazone   Bufferin plain or Extra Strength Feldene Capsules Oxycodone with Aspirin   Bufferin with Codeine Fenoprofen Fenoprofen Pabalate or Pabalate-SF   Buffets II Flogesic Panagesic   Buffinol plain or Extra Strength Florinal or Florinal with Codeine Panwarfarin   Buf-Tabs Flurbiprofen Penicillamine   Butalbital Compound Four-way cold tablets Penicillin   Butazolidin Fragmin Pepto-Bismol   Carbenicillin Geminisyn Percodan   Carna Arthritis Reliever Geopen Persantine   Carprofen Gold's salt Persistin   Chloramphenicol Goody's Phenylbutazone   Chloromycetin Haltrain Piroxlcam   Clmetidine heparin Plaquenil   Cllnoril Hyco-pap Ponstel   Clofibrate Hydroxy chloroquine Propoxyphen         Before stopping any of these medications, be sure to consult the physician who ordered  them.  Some, such as Coumadin (Warfarin) are ordered to prevent or treat serious conditions such as "deep thrombosis", "pumonary embolisms", and other heart problems.  The amount of time that you may need off of the medication may also vary with the medication and the reason for which you were taking it.  If you are taking any of these medications, please make sure you notify your pain physician before you undergo any procedures.         Selective Nerve Root Block Patient Information  Description: Specific nerve roots exit the spinal canal and these nerves can be compressed and inflamed by a bulging disc and bone spurs.  By injecting steroids on the nerve root, we can potentially decrease the inflammation surrounding these nerves, which often leads to decreased pain.  Also, by injecting local anesthesia  on the nerve root, this can provide [place] helpful information to give to your referring doctor if it decreases your pain.  Selective nerve root blocks can be done along the spine from the neck to the low back depending on the location of your pain.   After numbing the skin with local anesthesia, a small needle is passed to the nerve root and the position of the needle is verified using x-ray pictures.  After the needle is in correct position, we then deposit the medication.  You may experience a pressure sensation while this is being done.  The entire block usually lasts less than 15 minutes.  Conditions that may be treated with selective nerve root blocks:  Low back and leg pain  Spinal stenosis  Diagnostic block prior to potential surgery  Neck and arm pain  Post laminectomy syndrome  Preparation for the injection:  1. Do not eat any solid food or dairy products within 6 hours of your appointment. 2. You may drink clear liquids up to 2 hours before an appointment.  Clear liquids include water, black coffee, juice or soda.  No milk or cream please. 3. You may take your regular  medications, including pain medications, with a sip of water before your appointment.  Diabetics should hold regular insulin (if taken separately) and take 1/2 normal NPH dose the morning of the procedure.  Carry some sugar containing items with you to your appointment. 4. A driver must accompany you and be prepared to drive you home after your procedure. 5. Bring all your current medications with you. 6. An IV may be inserted and sedation may be given at the discretion of the physician. 7. A blood pressure cuff, EKG, and other monitors will often be applied during the procedure.  Some patients may need to have extra oxygen administered for a short period. 8. You will be asked to provide medical information, including allergies, prior to the procedure.  We must know immediately if you are taking blood  Thinners (like Coumadin) or if you are allergic to IV iodine contrast (dye).  Possible side-effects: All are usually temporary  Bleeding from needle site  Light headedness  Numbness and tingling  Decreased blood pressure  Weakness in arms/legs  Pressure sensation in back/neck  Pain at injection site (several days)  Possible complications: All are extremely rare  Infection  Nerve injury  Spinal headache (a headache wore with upright position)  Call if you experience:  Fever/chills associated with headache or increased back/neck pain  Headache worsened by an upright position  New onset weakness or numbness of an extremity below the injection site  Hives or difficulty breathing (go to the emergency room)  Inflammation or drainage at the injection site(s)  Severe back/neck pain greater than usual  New symptoms which are concerning to you  Please note:  Although the local anesthetic injected can often make your back or neck feel good for several hours after the injection the pain will likely return.  It takes 3-5 days for steroids to work on the nerve root. You may not  notice any pain relief for at least one week.  If effective, we will often do a series of 3 injections spaced 3-6 weeks apart to maximally decrease your pain.    If you have any questions, please call [phone] [facility]

## 2014-09-08 NOTE — Progress Notes (Signed)
Subjective:    Patient ID: Michelle Conway, female    DOB: [birth year], 54 y.o.   MRN: [id number]  HPI  Patient is 54 year old female returns to [place] for further evaluation and treatment of pain involving the lower back lower extremity regions status post left and right total knee replacements. There has been concern regarding component of complex regional pain syndrome contributing to patient's symptomatology and patient has had significant relief of pain following prior interventional treatment [facility]. The patient is in hopes of being able to undergo a most sacral selective nerve root block at time of return appointment in attempt to continue to minimize the severity of her symptoms of the lower extremities consisting of burning stinging throbbing pain. Patient has had significant relief of pain following somatic blocks consistently well lumbosacral selective nerve root block or then she has following lumbar sympathetic block. We will therefore proceed with the somatic block lumbosacral selective nerve root block which has been very effective in terms of reducing patient's pain and minimizing progression of patient's condition. The patient was understanding and will proceed with interventional treatment at time return appointment is discussed  Review of Systems      Objective:   Physical Exam   There was tenderness of the splenius capitis and occipitalis musculature regions of mild degree with mild tenderness of the acromioclavicular glenohumeral joint region. Patient with bilaterally equal grip strength with Tinel's and Phalen's maneuver without increased pain of any significant degree. There was minimal tenderness of the thoracic facet thoracic paraspinal musculature region. Palpation over the lumbar paraspinal musculature region lumbar facet region was associated with mild discomfort with lateral bending and rotation reproducing mild discomfort.  There was tends to palpation of the knees with well-healed surgical scars of the left and right knees without increased warmth or erythema of the knees. There was sensitivity to touch of the knees noted formed lesions of the skin noted. No increased warmth or erythema of the knees were noted. Leg raising limited to approximately 20 without increase of pain with dorsiflexion noted. Negative clonus negative Homans. Mild to moderate tenderness of the greater trochanteric region was noted Abdomen nontender and no costovertebral tenderness noted.There was tenderness of the splenius capitis and occipitalis musculature regions of mild degree with mild tenderness of the acromioclavicular glenohumeral joint region. Patient with bilaterally equal grip strength with Tinel's and Phalen's maneuver without increased pain of any significant degree. There was minimal tenderness of the thoracic facet thoracic paraspinal musculature region. Palpation over the lumbar paraspinal musculature region lumbar facet region was associated with mild discomfort with lateral bending and rotation reproducing mild discomfort. There was tends to palpation of the knees with well-healed surgical scars of the left and right knees without increased warmth or erythema of the knees. There was sensitivity to touch of the knees noted formed lesions of the skin noted. No increased warmth or erythema of the knees were noted. Leg raising limited to approximately 20 without increase of pain with dorsiflexion noted. Negative clonus negative Homans. Mild to moderate tenderness of the greater trochanteric region was noted Abdomen nontender and no costovertebral tenderness noted.    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  We'll consider lumbosacral selective nerve root block at time return appointment  F/U PCP for evaliation of  BP and general medical  condition.  F/U surgical evaluation.  F/U neurological evaluation.  May consider radiofrequency rhizolysis or intraspinal procedures pending response to present treatment and F/U evaluation.  Patient to call [facility] should patient have concerns prior to scheduled return appointment.

## 2014-09-08 NOTE — Progress Notes (Signed)
Safety precautions to be maintained throughout the outpatient stay will include: orient to surroundings, keep bed in low position, maintain call bell within reach at all times, provide assistance with transfer out of bed and ambulation.  Discharge patient home 0834 hrs; teach back done for procedure Script given for norco E script at pharmacy

## 2014-10-07 ENCOUNTER — Ambulatory Visit: Payer: Managed Care, Other (non HMO) | Attending: Pain Medicine | Admitting: Pain Medicine

## 2014-10-07 ENCOUNTER — Encounter: Payer: Self-pay | Admitting: Pain Medicine

## 2014-10-07 VITALS — BP 136/57 | HR 60 | Temp 97.9°F | Resp 14 | Ht 63.0 in | Wt 165.0 lb

## 2014-10-07 DIAGNOSIS — M47816 Spondylosis without myelopathy or radiculopathy, lumbar region: Secondary | ICD-10-CM | POA: Diagnosis not present

## 2014-10-07 DIAGNOSIS — M545 Low back pain, unspecified: Secondary | ICD-10-CM

## 2014-10-07 DIAGNOSIS — M79604 Pain in right leg: Secondary | ICD-10-CM | POA: Diagnosis present

## 2014-10-07 DIAGNOSIS — M79605 Pain in left leg: Secondary | ICD-10-CM | POA: Diagnosis present

## 2014-10-07 DIAGNOSIS — M5416 Radiculopathy, lumbar region: Secondary | ICD-10-CM

## 2014-10-07 DIAGNOSIS — G9050 Complex regional pain syndrome I, unspecified: Secondary | ICD-10-CM

## 2014-10-07 DIAGNOSIS — Z96653 Presence of artificial knee joint, bilateral: Secondary | ICD-10-CM

## 2014-10-07 MED ORDER — MIDAZOLAM HCL 5 MG/5ML IJ SOLN
INTRAMUSCULAR | Status: AC
  Administered 2014-10-07: 5 mg via INTRAVENOUS
  Filled 2014-10-07: qty 5

## 2014-10-07 MED ORDER — CEFAZOLIN SODIUM 1 G IJ SOLR
INTRAMUSCULAR | Status: AC
  Administered 2014-10-07: 1 g via INTRAVENOUS
  Filled 2014-10-07: qty 10

## 2014-10-07 MED ORDER — TRIAMCINOLONE ACETONIDE 40 MG/ML IJ SUSP
INTRAMUSCULAR | Status: AC
  Administered 2014-10-07: 10 mg
  Filled 2014-10-07: qty 1

## 2014-10-07 MED ORDER — HYDROCODONE-ACETAMINOPHEN 5-325 MG PO TABS: ORAL_TABLET | ORAL | Status: DC

## 2014-10-07 MED ORDER — FENTANYL CITRATE (PF) 100 MCG/2ML IJ SOLN
INTRAMUSCULAR | Status: AC
  Administered 2014-10-07: 100 ug via INTRAVENOUS
  Filled 2014-10-07: qty 2

## 2014-10-07 MED ORDER — CEFUROXIME AXETIL 250 MG PO TABS: 250.0000 mg | ORAL_TABLET | Freq: Two times a day (BID) | ORAL | Status: DC

## 2014-10-07 MED ORDER — ORPHENADRINE CITRATE 30 MG/ML IJ SOLN
INTRAMUSCULAR | Status: AC
  Administered 2014-10-07: 60 mg
  Filled 2014-10-07: qty 2

## 2014-10-07 MED ORDER — GABAPENTIN 300 MG PO CAPS: ORAL_CAPSULE | ORAL | Status: DC

## 2014-10-07 MED ORDER — BUPIVACAINE HCL (PF) 0.25 % IJ SOLN
INTRAMUSCULAR | Status: AC
  Administered 2014-10-07: 25 mL
  Filled 2014-10-07: qty 30

## 2014-10-07 NOTE — Progress Notes (Signed)
Discharged to home via wheelchair with script in hand for Hydrocodone.  Teach back 3 done.

## 2014-10-07 NOTE — Progress Notes (Signed)
Safety precautions to be maintained throughout the outpatient stay will include: orient to surroundings, keep bed in low position, maintain call bell within reach at all times, provide assistance with transfer out of bed and ambulation.  

## 2014-10-07 NOTE — Progress Notes (Signed)
Subjective:    Patient ID: Michelle Conway, female    DOB: 09/21/1960, 55 y.o.   MRN: [id number]  HPI  PROCEDURE PERFORMED: Lumbosacral selective nerve root block   NOTE: The patient is a 54 y.o. female who returns to [place] for further evaluation and treatment of pain involving the lumbar and lower extremity region. Studies consisting of MRI has revealed the patient to be with evidence of multilevel degenerative changes of the lumbar spine with L4-L5 level involvement being most severe. There is concern regarding intraspinal abnormalities contributing to the patient's symptomatology as well as concern regarding component of complex regional pain syndrome contributing to patient's symptomatology status post bilateral total knee replacement area we will proceed with lumbosacral selective nerve root block at this time an attempt to decrease severity of symptoms and minimize progression of patient's symptoms as well as avoid the need for more involved treatment. There has been concern regarding both sympathectomy mediated pain and sympathetic independent pain and patient has been with more improvement following somatic blocks we will therefore we will proceed with lumbosacral selective nerve root block at this time. The lumbar sympathetic blocks have been less effective in terms of relieving patient's pain The risks, benefits, and expectations of the procedure have been explained to the patient who was understanding and in agreement with suggested treatment plan. We will proceed with interventional treatment as discussed and as explained to the patient. The patient is understanding and in agreement with suggested treatment plan.   DESCRIPTION OF PROCEDURE: Lumbosacral selective nerve root block with IV Versed, IV fentanyl conscious sedation, EKG, blood pressure, pulse, and pulse oximetry monitoring. The procedure was performed with the patient in the prone position under fluoroscopic  guidance. With the patient in the prone position, Betadine prep of proposed entry site was performed. Local anesthetic skin wheal of proposed needle entry site was prepared with 1.5% plain lidocaine with AP view of the lumbosacral spine.   PROCEDURE #1: Needle placement at the L 2 vertebral body: A 22 -gauge needle was inserted at the inferior border of the transverse process of the vertebral body with needle placed medial to the midline of the transverse process on AP view of the lumbosacral spine.   NEEDLE PLACEMENT AT  L3, L4, and L5 vertebral body levels   Needle  placement was accomplished at L 3, L4, and L5   on the left side exactly as was accomplished at the L2 level utilizing the same technique and under fluoroscopic guidance.  PROCEDURE #4: Needle placement at the S1 foramen. With the patient in the prone position with Betadine prep of proposed entry site accomplished, the S1 foramen was visualized under fluoroscopic guidance with AP view of the lumbosacral spine with cephalad orientation of the fluoroscope with local anesthetic skin wheal of 1.5% lidocaine of proposed needle entry site prepared. A 22-gauge needle was inserted S1 foramen under fluoroscopic guidance eliciting paresthesias radiating from the buttocks to the lower extremity after which needle was slightly withdrawn.   Needle placement was then verified on lateral view at all levels with needle tip documented to be in the posterior superior quadrant of the intervertebral foramen of  L L2, L3, L4, and L5. Following negative aspiration for heme and CSF at each level, each level was injected with 3 mL of 0.25% bupivacaine with Kenalog.   LUMBOSACRAL SELECTIVE NERVE ROOT BLOCKS THE THE  RIGHT SIDE  The procedure was performed on the right side exactly as was performed on  the left side and at the same levels  Under fluoroscopic guidance and utilizing the same technique.    The patient tolerated the procedure well. A total of 10  mg of Kenalog was utilized for the procedure.   PLAN:  1. Medications: Will continue presently prescribed medications. Neurontin and hydrocodone acetaminophen 2. The patient is to undergo follow-up evaluation with primary care physician for evaluation of blood pressure and general medical condition status post procedure performed on today's visit. 3. Surgical follow-up evaluation as discussed 4. Neurological evaluation. 5. May consider radiofrequency procedures, implantation type procedures and other treatment pending response to treatment and follow-up evaluation. 6. The patient has been advise do adhere to proper body mechanics and avoid activities which may aggravate condition. 7. The patient has been advised to call the [facility] prior to scheduled return appointment should there be significant change in the patient's condition or should the patient have other concerns regarding condition prior to scheduled return appointment.      Review of Systems     Objective:   Physical Exam        Assessment & Plan:

## 2014-10-07 NOTE — Patient Instructions (Addendum)
Continue present medications and antibiotics. Please obtain your antibiotic Ceftin today and begin taking antibiotic today  F/U PCP for evaliation of  BP and general medical  condition.  F/U surgical evaluation as discussed  F/U neurological evaluation.  May consider radiofrequency rhizolysis or intraspinal procedures pending response to present treatment and F/U evaluation.  Patient to call [facility] Management Center should patient have concerns prior to scheduled return appointment.  Pain Management Discharge Instructions  General Discharge Instructions :  If you need to reach your doctor call: Monday-Friday 8:00 am - 4:00 pm at [phone] or toll free [phone].  After clinic hours [phone] to have operator reach doctor.  Bring all of your medication bottles to all your appointments in the pain clinic.  To cancel or reschedule your appointment with Pain Management please remember to call 24 hours in advance to avoid a fee.  Refer to the educational materials which you have been given on: General Risks, I had my Procedure. Discharge Instructions, Post Sedation.  Post Procedure Instructions:  The drugs you were given will stay in your system until tomorrow, so for the next 24 hours you should not drive, make any legal decisions or drink any alcoholic beverages.  You may eat anything you prefer, but it is better to start with liquids then soups and crackers, and gradually work up to solid foods.  Please notify your doctor immediately if you have any unusual bleeding, trouble breathing or pain that is not related to your normal pain.  Depending on the type of procedure that was done, some parts of your body may feel week and/or numb.  This usually clears up by tonight or the next day.  Walk with the use of an assistive device or accompanied by an adult for the 24 hours.  You may use ice on the affected area for the first 24 hours.  Put ice in a Ziploc bag and cover with a  towel and place against area 15 minutes on 15 minutes off.  You may switch to heat after 24 hours.Facet Joint Block The facet joints connect the bones of the spine (vertebrae). They make it possible for you to bend, twist, and make other movements with your spine. They also prevent you from overbending, overtwisting, and making other excessive movements.  A facet joint block is a procedure where a numbing medicine (anesthetic) is injected into a facet joint. Often, a type of anti-inflammatory medicine called a steroid is also injected. A facet joint block may be done for two reasons:   Diagnosis. A facet joint block may be done as a test to see whether neck or back pain is caused by a worn-down or infected facet joint. If the pain gets better after a facet joint block, it means the pain is probably coming from the facet joint. If the pain does not get better, it means the pain is probably not coming from the facet joint.   Therapy. A facet joint block may be done to relieve neck or back pain caused by a facet joint. A facet joint block is only done as a therapy if the pain does not improve with medicine, exercise programs, physical therapy, and other forms of pain management. LET [facility] CARE PROVIDER KNOW ABOUT:   Any allergies you have.   All medicines you are taking, including vitamins, herbs, eyedrops, and over-the-counter medicines and creams.   Previous problems you or members of your family have had with the use of anesthetics.   Any  blood disorders you have had.   Other health problems you have. RISKS AND COMPLICATIONS Generally, having a facet joint block is safe. However, as with any procedure, complications can occur. Possible complications associated with having a facet joint block include:   Bleeding.   Injury to a nerve near the injection site.   Pain at the injection site.   Weakness or numbness in areas controlled by nerves near the injection site.    Infection.   Temporary fluid retention.   Allergic reaction to anesthetics or medicines used during the procedure. BEFORE THE PROCEDURE   Follow your health care provider's instructions if you are taking dietary supplements or medicines. You may need to stop taking them or reduce your dosage.   Do not take any new dietary supplements or medicines without asking your health care provider first.   Follow your health care provider's instructions about eating and drinking before the procedure. You may need to stop eating and drinking several hours before the procedure.   Arrange to have an adult drive you home after the procedure. PROCEDURE  You may need to remove your clothing and dress in an open-back gown so that your health care provider can access your spine.   The procedure will be done while you are lying on an X-ray table. Most of the time you will be asked to lie on your stomach, but you may be asked to lie in a different position if an injection will be made in your neck.   Special machines will be used to monitor your oxygen levels, heart rate, and blood pressure.   If an injection will be made in your neck, an intravenous (IV) tube will be inserted into one of your veins. Fluids and medicine will flow directly into your body through the IV tube.   The area over the facet joint where the injection will be made will be cleaned with an antiseptic soap. The surrounding skin will be covered with sterile drapes.   An anesthetic will be applied to your skin to make the injection area numb. You may feel a temporary stinging or burning sensation.   A video X-ray machine will be used to locate the joint. A contrast dye may be injected into the facet joint area to help with locating the joint.   When the joint is located, an anesthetic medicine will be injected into the joint through the needle.   Your health care provider will ask you whether you feel pain relief. If  you do feel relief, a steroid may be injected to provide pain relief for a longer period of time. If you do not feel relief or feel only partial relief, additional injections of an anesthetic may be made in other facet joints.   The needle will be removed, the skin will be cleansed, and bandages will be applied.  AFTER THE PROCEDURE   You will be observed for 15-30 minutes before being allowed to go home. Do not drive. Have an adult drive you or take a taxi or public transportation instead.   If you feel pain relief, the pain will return in several hours or days when the anesthetic wears off.   You may feel pain relief 2-14 days after the procedure. The amount of time this relief lasts varies from person to person.   It is normal to feel some tenderness over the injected area(s) for 2 days following the procedure.   If you have diabetes, you may have a temporary  increase in blood sugar. Document Released: 08/09/2006 Document Revised: 08/04/2013 Document Reviewed: 01/08/2012 [facility] Patient Information 2015 [place], [place]. This information is not intended to replace advice given to you by your health care provider. Make sure you discuss any questions you have with your health care provider.

## 2014-10-08 ENCOUNTER — Telehealth: Payer: Self-pay | Admitting: *Deleted

## 2014-10-08 NOTE — Telephone Encounter (Signed)
No problems after procedure

## 2014-11-05 ENCOUNTER — Encounter: Payer: Self-pay | Admitting: Pain Medicine

## 2014-11-05 ENCOUNTER — Ambulatory Visit: Payer: Managed Care, Other (non HMO) | Attending: Pain Medicine | Admitting: Pain Medicine

## 2014-11-05 VITALS — BP 105/73 | HR 64 | Temp 98.7°F | Resp 18 | Ht 63.0 in | Wt 165.0 lb

## 2014-11-05 DIAGNOSIS — M5416 Radiculopathy, lumbar region: Secondary | ICD-10-CM

## 2014-11-05 DIAGNOSIS — M47816 Spondylosis without myelopathy or radiculopathy, lumbar region: Secondary | ICD-10-CM | POA: Diagnosis not present

## 2014-11-05 DIAGNOSIS — M7060 Trochanteric bursitis, unspecified hip: Secondary | ICD-10-CM | POA: Diagnosis not present

## 2014-11-05 DIAGNOSIS — M545 Low back pain, unspecified: Secondary | ICD-10-CM

## 2014-11-05 DIAGNOSIS — G90523 Complex regional pain syndrome I of lower limb, bilateral: Secondary | ICD-10-CM | POA: Insufficient documentation

## 2014-11-05 DIAGNOSIS — Z96653 Presence of artificial knee joint, bilateral: Secondary | ICD-10-CM | POA: Insufficient documentation

## 2014-11-05 DIAGNOSIS — G9050 Complex regional pain syndrome I, unspecified: Secondary | ICD-10-CM

## 2014-11-05 DIAGNOSIS — M79605 Pain in left leg: Secondary | ICD-10-CM | POA: Diagnosis present

## 2014-11-05 DIAGNOSIS — M5136 Other intervertebral disc degeneration, lumbar region: Secondary | ICD-10-CM | POA: Diagnosis not present

## 2014-11-05 DIAGNOSIS — M79604 Pain in right leg: Secondary | ICD-10-CM | POA: Diagnosis present

## 2014-11-05 MED ORDER — GABAPENTIN 300 MG PO CAPS: ORAL_CAPSULE | ORAL | Status: DC

## 2014-11-05 MED ORDER — HYDROCODONE-ACETAMINOPHEN 5-325 MG PO TABS: ORAL_TABLET | ORAL | Status: DC

## 2014-11-05 NOTE — Patient Instructions (Addendum)
Continue present medication Neurontin and hydrocodone acetaminophen  Lumbosacral selective nerve root block to be performed at time of return appointment  F/U PCP Dr. Rebecka Apley for evaliation of  BP and general medical  condition  F/U surgical evaluation Dr.Dellaero    F/U neurological evaluation  May consider radiofrequency rhizolysis or intraspinal procedures pending response to present treatment and F/U evaluation   Patient to call Pain Management Center should patient have concerns prior to scheduled return appointmen. Selective Nerve Root Block Patient Information  Description: Specific nerve roots exit the spinal canal and these nerves can be compressed and inflamed by a bulging disc and bone spurs.  By injecting steroids on the nerve root, we can potentially decrease the inflammation surrounding these nerves, which often leads to decreased pain.  Also, by injecting local anesthesia on the nerve root, this can provide [place] helpful information to give to your referring doctor if it decreases your pain.  Selective nerve root blocks can be done along the spine from the neck to the low back depending on the location of your pain.   After numbing the skin with local anesthesia, a small needle is passed to the nerve root and the position of the needle is verified using x-ray pictures.  After the needle is in correct position, we then deposit the medication.  You may experience a pressure sensation while this is being done.  The entire block usually lasts less than 15 minutes.  Conditions that may be treated with selective nerve root blocks:  Low back and leg pain  Spinal stenosis  Diagnostic block prior to potential surgery  Neck and arm pain  Post laminectomy syndrome  Preparation for the injection:  1. Do not eat any solid food or dairy products within 6 hours of your appointment. 2. You may drink clear liquids up to 2 hours before an appointment.  Clear liquids include water, black  coffee, juice or soda.  No milk or cream please. 3. You may take your regular medications, including pain medications, with a sip of water before your appointment.  Diabetics should hold regular insulin (if taken separately) and take 1/2 normal NPH dose the morning of the procedure.  Carry some sugar containing items with you to your appointment. 4. A driver must accompany you and be prepared to drive you home after your procedure. 5. Bring all your current medications with you. 6. An IV may be inserted and sedation may be given at the discretion of the physician. 7. A blood pressure cuff, EKG, and other monitors will often be applied during the procedure.  Some patients may need to have extra oxygen administered for a short period. 8. You will be asked to provide medical information, including allergies, prior to the procedure.  We must know immediately if you are taking blood  Thinners (like Coumadin) or if you are allergic to IV iodine contrast (dye).  Possible side-effects: All are usually temporary  Bleeding from needle site  Light headedness  Numbness and tingling  Decreased blood pressure  Weakness in arms/legs  Pressure sensation in back/neck  Pain at injection site (several days)  Possible complications: All are extremely rare  Infection  Nerve injury  Spinal headache (a headache wore with upright position)  Call if you experience:  Fever/chills associated with headache or increased back/neck pain  Headache worsened by an upright position  New onset weakness or numbness of an extremity below the injection site  Hives or difficulty breathing (go to the emergency room)  Inflammation or drainage at the injection site(s)  Severe back/neck pain greater than usual  New symptoms which are concerning to you  Please note:  Although the local anesthetic injected can often make your back or neck feel good for several hours after the injection the pain will likely  return.  It takes 3-5 days for steroids to work on the nerve root. You may not notice any pain relief for at least one week.  If effective, we will often do a series of 3 injections spaced 3-6 weeks apart to maximally decrease your pain.    If you have any questions, please call [phone] [facility] Clinic[facility] RISKS AND COMPLICATIONS  What are the risk, side effects and possible complications? Generally speaking, most procedures are safe.  However, with any procedure there are risks, side effects, and the possibility of complications.  The risks and complications are dependent upon the sites that are lesioned, or the type of nerve block to be performed.  The closer the procedure is to the spine, the more serious the risks are.  Great care is taken when placing the radio frequency needles, block needles or lesioning probes, but sometimes complications can occur. 1. Infection: Any time there is an injection through the skin, there is a risk of infection.  This is why sterile conditions are used for these blocks.  There are four possible types of infection. 1. Localized skin infection. 2. Central Nervous System Infection-This can be in the form of Meningitis, which can be deadly. 3. Epidural Infections-This can be in the form of an epidural abscess, which can cause pressure inside of the spine, causing compression of the spinal cord with subsequent paralysis. This would require an emergency surgery to decompress, and there are no guarantees that the patient would recover from the paralysis. 4. Discitis-This is an infection of the intervertebral discs.  It occurs in about 1% of discography procedures.  It is difficult to treat and it may lead to surgery.        2. Pain: the needles have to go through skin and soft tissues, will cause soreness.       3. Damage to internal structures:  The nerves to be lesioned may be near blood vessels or    other nerves which can be  potentially damaged.       4. Bleeding: Bleeding is more common if the patient is taking blood thinners such as  aspirin, Coumadin, Ticiid, Plavix, etc., or if he/she have some genetic predisposition  such as hemophilia. Bleeding into the spinal canal can cause compression of the spinal  cord with subsequent paralysis.  This would require an emergency surgery to  decompress and there are no guarantees that the patient would recover from the  paralysis.       5. Pneumothorax:  Puncturing of a lung is a possibility, every time a needle is introduced in  the area of the chest or upper back.  Pneumothorax refers to free air around the  collapsed lung(s), inside of the thoracic cavity (chest cavity).  Another two possible  complications related to a similar event would include: Hemothorax and Chylothorax.   These are variations of the Pneumothorax, where instead of air around the collapsed  lung(s), you may have blood or chyle, respectively.       6. Spinal headaches: They may occur with any procedures in the area of the spine.       7. Persistent CSF (Cerebro-Spinal Fluid) leakage:  This is a rare problem, but may occur  with prolonged intrathecal or epidural catheters either due to the formation of a fistulous  track or a dural tear.       8. Nerve damage: By working so close to the spinal cord, there is always a possibility of  nerve damage, which could be as serious as a permanent spinal cord injury with  paralysis.       9. Death:  Although rare, severe deadly allergic reactions known as "Anaphylactic  reaction" can occur to any of the medications used.      10. Worsening of the symptoms:  We can always make thing worse.  What are the chances of something like this happening? Chances of any of this occuring are extremely low.  By statistics, you have more of a chance of getting killed in a motor vehicle accident: while driving to the hospital than any of the above occurring .  Nevertheless, you should be  aware that they are possibilities.  In general, it is similar to taking a shower.  Everybody knows that you can slip, hit your head and get killed.  Does that mean that you should not shower again?  Nevertheless always keep in mind that statistics do not mean anything if you happen to be on the wrong side of them.  Even if a procedure has a 1 (one) in a 1,000,000 (million) chance of going wrong, it you happen to be that one..Also, keep in mind that by statistics, you have more of a chance of having something go wrong when taking medications.  Who should not have this procedure? If you are on a blood thinning medication (e.g. Coumadin, Plavix, see list of "Blood Thinners"), or if you have an active infection going on, you should not have the procedure.  If you are taking any blood thinners, please inform your physician.  How should I prepare for this procedure?  Do not eat or drink anything at least six hours prior to the procedure.  Bring a driver with you .  It cannot be a taxi.  Come accompanied by an adult that can drive you back, and that is strong enough to help you if your legs get weak or numb from the local anesthetic.  Take all of your medicines the morning of the procedure with just enough water to swallow them.  If you have diabetes, make sure that you are scheduled to have your procedure done first thing in the morning, whenever possible.  If you have diabetes, take only half of your insulin dose and notify our nurse that you have done so as soon as you arrive at the clinic.  If you are diabetic, but only take blood sugar pills (oral hypoglycemic), then do not take them on the morning of your procedure.  You may take them after you have had the procedure.  Do not take aspirin or any aspirin-containing medications, at least eleven (11) days prior to the procedure.  They may prolong bleeding.  Wear loose fitting clothing that may be easy to take off and that you would not mind if it  got stained with Betadine or blood.  Do not wear any jewelry or perfume  Remove any nail coloring.  It will interfere with some of our monitoring equipment.  NOTE: Remember that this is not meant to be interpreted as a complete list of all possible complications.  Unforeseen problems may occur.  BLOOD THINNERS The following drugs contain aspirin or  other products, which can cause increased bleeding during surgery and should not be taken for 2 weeks prior to and 1 week after surgery.  If you should need take something for relief of minor pain, you may take acetaminophen which is found in Tylenol,m Datril, Anacin-3 and Panadol. It is not blood thinner. The products listed below are.  Do not take any of the products listed below in addition to any listed on your instruction sheet.  A.P.C or A.P.C with Codeine Codeine Phosphate Capsules #3 Ibuprofen Ridaura  ABC compound Congesprin Imuran rimadil  Advil Cope Indocin Robaxisal  Alka-Seltzer Effervescent Pain Reliever and Antacid Coricidin or Coricidin-D  Indomethacin Rufen  Alka-Seltzer plus Cold Medicine Cosprin Ketoprofen S-A-C Tablets  Anacin Analgesic Tablets or Capsules Coumadin Korlgesic Salflex  Anacin Extra Strength Analgesic tablets or capsules CP-2 Tablets Lanoril Salicylate  Anaprox Cuprimine Capsules Levenox Salocol  Anexsia-D Dalteparin Magan Salsalate  Anodynos Darvon compound Magnesium Salicylate Sine-off  Ansaid Dasin Capsules Magsal Sodium Salicylate  Anturane Depen Capsules Marnal Soma  APF Arthritis pain formula Dewitt's Pills Measurin Stanback  Argesic Dia-Gesic Meclofenamic Sulfinpyrazone  Arthritis Bayer Timed Release Aspirin Diclofenac Meclomen Sulindac  Arthritis pain formula Anacin Dicumarol Medipren Supac  Analgesic (Safety coated) Arthralgen Diffunasal Mefanamic Suprofen  Arthritis Strength Bufferin Dihydrocodeine Mepro Compound Suprol  Arthropan liquid Dopirydamole Methcarbomol with Aspirin Synalgos  ASA  tablets/Enseals Disalcid Micrainin Tagament  Ascriptin Doan's Midol Talwin  Ascriptin A/D Dolene Mobidin Tanderil  Ascriptin Extra Strength Dolobid Moblgesic Ticlid  Ascriptin with Codeine Doloprin or Doloprin with Codeine Momentum Tolectin  Asperbuf Duoprin Mono-gesic Trendar  Aspergum Duradyne Motrin or Motrin IB Triminicin  Aspirin plain, buffered or enteric coated Durasal Myochrisine Trigesic  Aspirin Suppositories Easprin Nalfon Trillsate  Aspirin with Codeine Ecotrin Regular or Extra Strength Naprosyn Uracel  Atromid-S Efficin Naproxen Ursinus  Auranofin Capsules Elmiron Neocylate Vanquish  Axotal Emagrin Norgesic Verin  Azathioprine Empirin or Empirin with Codeine Normiflo Vitamin E  Azolid Emprazil Nuprin Voltaren  Bayer Aspirin plain, buffered or children's or timed BC Tablets or powders Encaprin Orgaran Warfarin Sodium  Buff-a-Comp Enoxaparin Orudis Zorpin  Buff-a-Comp with Codeine Equegesic Os-Cal-Gesic   Buffaprin Excedrin plain, buffered or Extra Strength Oxalid   Bufferin Arthritis Strength Feldene Oxphenbutazone   Bufferin plain or Extra Strength Feldene Capsules Oxycodone with Aspirin   Bufferin with Codeine Fenoprofen Fenoprofen Pabalate or Pabalate-SF   Buffets II Flogesic Panagesic   Buffinol plain or Extra Strength Florinal or Florinal with Codeine Panwarfarin   Buf-Tabs Flurbiprofen Penicillamine   Butalbital Compound Four-way cold tablets Penicillin   Butazolidin Fragmin Pepto-Bismol   Carbenicillin Geminisyn Percodan   Carna Arthritis Reliever Geopen Persantine   Carprofen Gold's salt Persistin   Chloramphenicol Goody's Phenylbutazone   Chloromycetin Haltrain Piroxlcam   Clmetidine heparin Plaquenil   Cllnoril Hyco-pap Ponstel   Clofibrate Hydroxy chloroquine Propoxyphen         Before stopping any of these medications, be sure to consult the physician who ordered them.  Some, such as Coumadin (Warfarin) are ordered to prevent or treat serious conditions  such as "deep thrombosis", "pumonary embolisms", and other heart problems.  The amount of time that you may need off of the medication may also vary with the medication and the reason for which you were taking it.  If you are taking any of these medications, please make sure you notify your pain physician before you undergo any procedures.

## 2014-11-05 NOTE — Progress Notes (Signed)
   Subjective:    Patient ID: Michelle Conway, female    DOB: 29-Aug-1960, 54 y.o.   MRN: [id number]  HPI  Patient 54 year old female returns to [place] for further evaluation and treatment of pain involving the lower back lower extremity regions with pain involving the left and right knees. Patient is status post left and right total knee replacement. Patient has had significant improvement of her pain with prior lumbosacral selective nerve root blocks. There is making concern regarding sympathectomy mediated pain as well as sympathetic sympathetic independent pain contributing to patient's symptomatology. We will consider patient for interventional treatment at time return appointment in attempt to minimize progression of patient's symptoms, avoid the need for more involved treatment and retard progression of patient's symptoms. The patient was understanding and agreement with suggested treatment plan.    Review of Systems     Objective:   Physical Exam  There was mild tenderness of the splenius capitis and occipitalis musculature region. There was unremarkable Spurling's maneuver and patient was with bilaterally equal grip strength. Tinel and Phalen's maneuver without increased pain of any significant degree. Palpation of the thoracic facet thoracic paraspinal musculature region was a tends to palpation of mild degree with no crepitus of the thoracic region noted. Palpation over the lumbar paraspinal musculature region lumbar facet region was associated with moderate discomfort. Extension and palpation of the lumbar facets reproduce moderate discomfort. Palpation of the knees reveal patient to be with tenderness to palpation of the knees with without increased without increased warmth or erythema. There was well-healed scars of the knee with increased sensitivity to touch of the knees. No increased warmth or erythema the knees was noted. Nasal tolerates approximately 20 without  increased pain with dorsiflexion noted. There was negative clonus negative Homans. Abdomen was nontender with no costovertebral angle tenderness noted.       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 Neurontin and hydrocodone acetaminophen  We'll consider lumbosacral selective nerve root block at time return appointment  F/U PCP Dr. Rebecka Apley for evaliation of  BP and general medical  condition.  F/U surgical evaluation. Patient will follow-up with Dr.Dellaero as needed   F/U neurological evaluation.  May consider radiofrequency rhizolysis or intraspinal procedures pending response to present treatment and F/U evaluation.  Patient to call Pain Management Center should patient have concerns prior to scheduled return appointment.

## 2014-11-05 NOTE — Progress Notes (Signed)
Safety precautions to be maintained throughout the outpatient stay will include: orient to surroundings, keep bed in low position, maintain call bell within reach at all times, provide assistance with transfer out of bed and ambulation.  

## 2014-11-16 ENCOUNTER — Other Ambulatory Visit: Payer: Self-pay | Admitting: Pain Medicine

## 2014-12-09 ENCOUNTER — Ambulatory Visit: Payer: Managed Care, Other (non HMO) | Attending: Pain Medicine | Admitting: Pain Medicine

## 2014-12-09 ENCOUNTER — Encounter: Payer: Self-pay | Admitting: Pain Medicine

## 2014-12-09 VITALS — BP 103/62 | HR 56 | Temp 98.0°F | Resp 16 | Ht 63.0 in | Wt 168.0 lb

## 2014-12-09 DIAGNOSIS — M545 Low back pain, unspecified: Secondary | ICD-10-CM

## 2014-12-09 DIAGNOSIS — M79604 Pain in right leg: Secondary | ICD-10-CM | POA: Diagnosis present

## 2014-12-09 DIAGNOSIS — M5416 Radiculopathy, lumbar region: Secondary | ICD-10-CM

## 2014-12-09 DIAGNOSIS — M79605 Pain in left leg: Secondary | ICD-10-CM | POA: Diagnosis present

## 2014-12-09 DIAGNOSIS — M5136 Other intervertebral disc degeneration, lumbar region: Secondary | ICD-10-CM | POA: Insufficient documentation

## 2014-12-09 DIAGNOSIS — Z96653 Presence of artificial knee joint, bilateral: Secondary | ICD-10-CM | POA: Insufficient documentation

## 2014-12-09 DIAGNOSIS — G9050 Complex regional pain syndrome I, unspecified: Secondary | ICD-10-CM

## 2014-12-09 MED ORDER — HYDROCODONE-ACETAMINOPHEN 5-325 MG PO TABS: ORAL_TABLET | ORAL | Status: DC

## 2014-12-09 MED ORDER — BUPIVACAINE HCL (PF) 0.25 % IJ SOLN
INTRAMUSCULAR | Status: DC
Start: 2014-12-09 — End: 2014-12-09
  Filled 2014-12-09: qty 30

## 2014-12-09 MED ORDER — TRIAMCINOLONE ACETONIDE 40 MG/ML IJ SUSP (RADIOLOGY): 40.0000 mg | Freq: Once | INTRAMUSCULAR | Status: DC

## 2014-12-09 MED ORDER — LACTATED RINGERS IV SOLN: 1000.0000 mL | INTRAVENOUS | Status: DC

## 2014-12-09 MED ORDER — FENTANYL CITRATE (PF) 100 MCG/2ML IJ SOLN
INTRAMUSCULAR | Status: AC
  Filled 2014-12-09: qty 2

## 2014-12-09 MED ORDER — FENTANYL CITRATE (PF) 100 MCG/2ML IJ SOLN: 100.0000 ug | Freq: Once | INTRAMUSCULAR | Status: DC

## 2014-12-09 MED ORDER — CEFAZOLIN SODIUM 1-5 GM-% IV SOLN: 1.0000 g | Freq: Once | INTRAVENOUS | Status: DC

## 2014-12-09 MED ORDER — MIDAZOLAM HCL 5 MG/5ML IJ SOLN: 5.0000 mg | Freq: Once | INTRAMUSCULAR | Status: DC

## 2014-12-09 MED ORDER — CEFAZOLIN SODIUM 1 G IJ SOLR
INTRAMUSCULAR | Status: AC
  Filled 2014-12-09: qty 10

## 2014-12-09 MED ORDER — BUPIVACAINE HCL (PF) 0.25 % IJ SOLN: 30.0000 mL | Freq: Once | INTRAMUSCULAR | Status: DC

## 2014-12-09 MED ORDER — TRIAMCINOLONE ACETONIDE 40 MG/ML IJ SUSP
INTRAMUSCULAR | Status: AC
  Filled 2014-12-09: qty 1

## 2014-12-09 MED ORDER — CEFUROXIME AXETIL 250 MG PO TABS: 250.0000 mg | ORAL_TABLET | Freq: Two times a day (BID) | ORAL | Status: DC

## 2014-12-09 MED ORDER — ORPHENADRINE CITRATE 30 MG/ML IJ SOLN
INTRAMUSCULAR | Status: AC
  Filled 2014-12-09: qty 2

## 2014-12-09 MED ORDER — MIDAZOLAM HCL 5 MG/5ML IJ SOLN
INTRAMUSCULAR | Status: AC
  Filled 2014-12-09: qty 5

## 2014-12-09 NOTE — Progress Notes (Signed)
Subjective:    Patient ID: Michelle Conway, female    DOB: 26-Mar-1961, 54 y.o.   MRN: [id number]  HPI  PROCEDURE PERFORMED: Lumbosacral selective nerve root block   NOTE: The patient is a 54 y.o. female who returns to [facility] for further evaluation and treatment of pain involving the lumbar and lower extremity region. 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 Patient is status post left and right total knee replacements with severe burning stinging throbbing pain of the lower extremities. There is concern regarding component of complex regional pain syndrome of the be present and contributing to patient's symptomatology as well as intraspinal abnormalities contributing to the patient's symptomatology. . There is concern regarding both sympathetic mediated pain and sympathetic independent pain felt to be present and contributed to patient's symptomatology. Patient has had significant benefit from lumbosacral selective nerve root blocks and lumbar sympathetic blocks. The risks, benefits, and expectations of the procedure have been explained to the patient who was understanding and in agreement with suggested treatment plan. We will proceed with interventional treatment as discussed and as explained to the patient. The patient is understanding and in agreement with suggested treatment plan.   DESCRIPTION OF PROCEDURE: Lumbosacral selective nerve root block with IV Versed, IV fentanyl conscious sedation, EKG, blood pressure, pulse, and pulse oximetry monitoring. The procedure was performed with the patient in the prone position under fluoroscopic guidance. With the patient in the prone position, Betadine prep of proposed entry site was performed. Local anesthetic skin wheal of proposed needle entry site was prepared with 1.5% plain  lidocaine with AP view of the lumbosacral spine.   PROCEDURE #1: Needle placement at the left L 2 vertebral body: A 22 -gauge needle was inserted at the inferior border of the transverse process of the vertebral body with needle placed medial to the midline of the transverse process on AP view of the lumbosacral spine.   NEEDLE PLACEMENT AT  L3, L4, and L5  VERTEBRAL BODY LEVELS  Needle  placement was accomplished at L3, L4, and L5  vertebral body levels on the left side exactly as was accomplished at the L2  vertebral body level  and utilizing the same technique and under fluoroscopic guidance.    Needle placement was then verified on lateral view at all levels with needle tip documented to be in the posterior superior quadrant of the intervertebral foramen of  L 2, L3, L4, and L5. Following negative aspiration for heme and CSF at each level, each level was injected with 3 mL of 0.25% bupivacaine with Kenalog.   LUMBOSACRAL SELECTIVE NERVE ROOT BLOCKS THE THE  RIGHT SIDE  The procedure was performed on the right side exactly as was performed on the left side and at the same levels  Under fluoroscopic guidance and utilizing the same technique.    The patient tolerated the procedure well. A total of 10 mg of Kenalog was utilized for the procedure.   PLAN:  1. Medications: Will continue presently prescribed medications Neurontin and hydrocodone acetaminophen 2. The patient is to undergo follow-up evaluation with PCP Dr. Rebecka Apley for evaluation of blood pressure and general medical condition status post procedure performed on today's visit. 3. Surgical follow-up evaluation. The patient will undergo further surgical evaluation of knees as discussed.. 4. Neurological evaluation. We may consider [facility] EMG studies and other studies as  discussed 5. Vascular evaluation. We may consider vascular evaluation pending response to treatment and follow-up evaluation. 6. May consider radiofrequency  procedures, implantation type procedures and other treatment pending response to treatment and follow-up evaluation. 7. The patient has been advise do adhere to proper body mechanics and avoid activities which may aggravate condition. 8. The patient has been advised to call the Pain Management Center prior to scheduled return appointment should there be significant change in the patient's condition or should the patient have other concerns regarding condition prior to scheduled return appointment.      Review of Systems     Objective:   Physical Exam        Assessment & Plan:

## 2014-12-09 NOTE — Patient Instructions (Addendum)
PLAN  Continue present medication Neurontin and hydrocodone acetaminophen and begin taking antibiotic Ceftin as prescribed. Please obtain your antibiotic today and begin taking antibiotic today  F/U PCP Dr. Rebecka Apley for evaliation of  BP and general medical  condition.  F/U surgical evaluation as discussed. Recommend obtaining surgical evaluation as discussed   F/U neurological evaluation. May consider pending follow-up evaluations  May consider radiofrequency rhizolysis or intraspinal procedures pending response to present treatment and F/U evaluation.  Patient to call Pain Management Center should patient have concerns prior to scheduled return appointment.   Pain Management Discharge Instructions  General Discharge Instructions :  If you need to reach your doctor call: Monday-Friday 8:00 am - 4:00 pm at [phone] or toll free [phone].  After clinic hours [phone] to have operator reach doctor.  Bring all of your medication bottles to all your appointments in the pain clinic.  To cancel or reschedule your appointment with Pain Management please remember to call 24 hours in advance to avoid a fee.  Refer to the educational materials which you have been given on: General Risks, I had my Procedure. Discharge Instructions, Post Sedation.  Post Procedure Instructions:  The drugs you were given will stay in your system until tomorrow, so for the next 24 hours you should not drive, make any legal decisions or drink any alcoholic beverages.  You may eat anything you prefer, but it is better to start with liquids then soups and crackers, and gradually work up to solid foods.  Please notify your doctor immediately if you have any unusual bleeding, trouble breathing or pain that is not related to your normal pain.  Depending on the type of procedure that was done, some parts of your body may feel week and/or numb.  This usually clears up by tonight or the next day.  Walk with  the use of an assistive device or accompanied by an adult for the 24 hours.  You may use ice on the affected area for the first 24 hours.  Put ice in a Ziploc bag and cover with a towel and place against area 15 minutes on 15 minutes off.  You may switch to heat after 24 hours.Selective Nerve Root Block Patient Information  Description: Specific nerve roots exit the spinal canal and these nerves can be compressed and inflamed by a bulging disc and bone spurs.  By injecting steroids on the nerve root, we can potentially decrease the inflammation surrounding these nerves, which often leads to decreased pain.  Also, by injecting local anesthesia on the nerve root, this can provide [place] helpful information to give to your referring doctor if it decreases your pain.  Selective nerve root blocks can be done along the spine from the neck to the low back depending on the location of your pain.   After numbing the skin with local anesthesia, a small needle is passed to the nerve root and the position of the needle is verified using x-ray pictures.  After the needle is in correct position, we then deposit the medication.  You may experience a pressure sensation while this is being done.  The entire block usually lasts less than 15 minutes.  Conditions that may be treated with selective nerve root blocks:  Low back and leg pain  Spinal stenosis  Diagnostic block prior to potential surgery  Neck and arm pain  Post laminectomy syndrome  Preparation for the injection:  1. Do not eat any solid food or dairy products within 6 hours  of your appointment. 2. You may drink clear liquids up to 2 hours before an appointment.  Clear liquids include water, black coffee, juice or soda.  No milk or cream please. 3. You may take your regular medications, including pain medications, with a sip of water before your appointment.  Diabetics should hold regular insulin (if taken separately) and take 1/2 normal NPH dose the  morning of the procedure.  Carry some sugar containing items with you to your appointment. 4. A driver must accompany you and be prepared to drive you home after your procedure. 5. Bring all your current medications with you. 6. An IV may be inserted and sedation may be given at the discretion of the physician. 7. A blood pressure cuff, EKG, and other monitors will often be applied during the procedure.  Some patients may need to have extra oxygen administered for a short period. 8. You will be asked to provide medical information, including allergies, prior to the procedure.  We must know immediately if you are taking blood  Thinners (like Coumadin) or if you are allergic to IV iodine contrast (dye).  Possible side-effects: All are usually temporary  Bleeding from needle site  Light headedness  Numbness and tingling  Decreased blood pressure  Weakness in arms/legs  Pressure sensation in back/neck  Pain at injection site (several days)  Possible complications: All are extremely rare  Infection  Nerve injury  Spinal headache (a headache wore with upright position)  Call if you experience:  Fever/chills associated with headache or increased back/neck pain  Headache worsened by an upright position  New onset weakness or numbness of an extremity below the injection site  Hives or difficulty breathing (go to the emergency room)  Inflammation or drainage at the injection site(s)  Severe back/neck pain greater than usual  New symptoms which are concerning to you  Please note:  Although the local anesthetic injected can often make your back or neck feel good for several hours after the injection the pain will likely return.  It takes 3-5 days for steroids to work on the nerve root. You may not notice any pain relief for at least one week.  If effective, we will often do a series of 3 injections spaced 3-6 weeks apart to maximally decrease your pain.    If you have  any questions, please call [phone] [facility]

## 2014-12-09 NOTE — Progress Notes (Signed)
Safety precautions to be maintained throughout the outpatient stay will include: orient to surroundings, keep bed in low position, maintain call bell within reach at all times, provide assistance with transfer out of bed and ambulation.  

## 2014-12-10 ENCOUNTER — Telehealth: Payer: Self-pay | Admitting: *Deleted

## 2014-12-10 NOTE — Telephone Encounter (Signed)
No problems post procedure phone call. 

## 2015-01-07 ENCOUNTER — Ambulatory Visit: Payer: Managed Care, Other (non HMO) | Attending: Pain Medicine | Admitting: Pain Medicine

## 2015-01-07 ENCOUNTER — Encounter: Payer: Self-pay | Admitting: Pain Medicine

## 2015-01-07 VITALS — BP 136/71 | HR 64 | Temp 98.0°F | Resp 16 | Ht 63.0 in | Wt 165.0 lb

## 2015-01-07 DIAGNOSIS — M5136 Other intervertebral disc degeneration, lumbar region: Secondary | ICD-10-CM | POA: Diagnosis not present

## 2015-01-07 DIAGNOSIS — M7060 Trochanteric bursitis, unspecified hip: Secondary | ICD-10-CM | POA: Diagnosis not present

## 2015-01-07 DIAGNOSIS — M5416 Radiculopathy, lumbar region: Secondary | ICD-10-CM

## 2015-01-07 DIAGNOSIS — M47816 Spondylosis without myelopathy or radiculopathy, lumbar region: Secondary | ICD-10-CM | POA: Diagnosis not present

## 2015-01-07 DIAGNOSIS — G90523 Complex regional pain syndrome I of lower limb, bilateral: Secondary | ICD-10-CM | POA: Diagnosis not present

## 2015-01-07 DIAGNOSIS — Z96653 Presence of artificial knee joint, bilateral: Secondary | ICD-10-CM | POA: Insufficient documentation

## 2015-01-07 DIAGNOSIS — M545 Low back pain, unspecified: Secondary | ICD-10-CM

## 2015-01-07 DIAGNOSIS — G9050 Complex regional pain syndrome I, unspecified: Secondary | ICD-10-CM

## 2015-01-07 MED ORDER — HYDROCODONE-ACETAMINOPHEN 5-325 MG PO TABS: ORAL_TABLET | ORAL | Status: DC

## 2015-01-07 MED ORDER — ORPHENADRINE CITRATE ER 100 MG PO TB12
ORAL_TABLET | ORAL | Status: DC
Start: 2015-01-07 — End: 2015-02-03

## 2015-01-07 MED ORDER — GABAPENTIN 300 MG PO CAPS: ORAL_CAPSULE | ORAL | Status: DC

## 2015-01-07 NOTE — Patient Instructions (Addendum)
PLAN   Continue present medication Neurontin and hydrocodone acetaminophen and begin Norflex Norflex and the other medications can cause respiratory depression confusion excessive sedation and other side effects. Exercise extreme caution when taking these medications  Lumbosacral selective nerve root block to be performed at time of return appointment  F/U PCP Dr. Rebecka Apley for evaliation of  BP and general medical  condition  F/U surgical evaluation as discussed  F/U neurological evaluation. May consider pending follow-up evaluations  May consider radiofrequency rhizolysis or intraspinal procedures pending response to present treatment and F/U evaluation   Patient to call [facility] should patient have concerns prior to scheduled return appointment. GENERAL RISKS AND COMPLICATIONS  What are the risk, side effects and possible complications? Generally speaking, most procedures are safe.  However, with any procedure there are risks, side effects, and the possibility of complications.  The risks and complications are dependent upon the sites that are lesioned, or the type of nerve block to be performed.  The closer the procedure is to the spine, the more serious the risks are.  Great care is taken when placing the radio frequency needles, block needles or lesioning probes, but sometimes complications can occur. 1. Infection: Any time there is an injection through the skin, there is a risk of infection.  This is why sterile conditions are used for these blocks.  There are four possible types of infection. 1. Localized skin infection. 2. Central Nervous System Infection-This can be in the form of Meningitis, which can be deadly. 3. Epidural Infections-This can be in the form of an epidural abscess, which can cause pressure inside of the spine, causing compression of the spinal cord with subsequent paralysis. This would require an emergency surgery to decompress, and there are no  guarantees that the patient would recover from the paralysis. 4. Discitis-This is an infection of the intervertebral discs.  It occurs in about 1% of discography procedures.  It is difficult to treat and it may lead to surgery.        2. Pain: the needles have to go through skin and soft tissues, will cause soreness.       3. Damage to internal structures:  The nerves to be lesioned may be near blood vessels or    other nerves which can be potentially damaged.       4. Bleeding: Bleeding is more common if the patient is taking blood thinners such as  aspirin, Coumadin, Ticiid, Plavix, etc., or if he/she have some genetic predisposition  such as hemophilia. Bleeding into the spinal canal can cause compression of the spinal  cord with subsequent paralysis.  This would require an emergency surgery to  decompress and there are no guarantees that the patient would recover from the  paralysis.       5. Pneumothorax:  Puncturing of a lung is a possibility, every time a needle is introduced in  the area of the chest or upper back.  Pneumothorax refers to free air around the  collapsed lung(s), inside of the thoracic cavity (chest cavity).  Another two possible  complications related to a similar event would include: Hemothorax and Chylothorax.   These are variations of the Pneumothorax, where instead of air around the collapsed  lung(s), you may have blood or chyle, respectively.       6. Spinal headaches: They may occur with any procedures in the area of the spine.       7. Persistent CSF (Cerebro-Spinal Fluid) leakage: This  is a rare problem, but may occur  with prolonged intrathecal or epidural catheters either due to the formation of a fistulous  track or a dural tear.       8. Nerve damage: By working so close to the spinal cord, there is always a possibility of  nerve damage, which could be as serious as a permanent spinal cord injury with  paralysis.       9. Death:  Although rare, severe deadly allergic  reactions known as "Anaphylactic  reaction" can occur to any of the medications used.      10. Worsening of the symptoms:  We can always make thing worse.  What are the chances of something like this happening? Chances of any of this occuring are extremely low.  By statistics, you have more of a chance of getting killed in a motor vehicle accident: while driving to the hospital than any of the above occurring .  Nevertheless, you should be aware that they are possibilities.  In general, it is similar to taking a shower.  Everybody knows that you can slip, hit your head and get killed.  Does that mean that you should not shower again?  Nevertheless always keep in mind that statistics do not mean anything if you happen to be on the wrong side of them.  Even if a procedure has a 1 (one) in a 1,000,000 (million) chance of going wrong, it you happen to be that one..Also, keep in mind that by statistics, you have more of a chance of having something go wrong when taking medications.  Who should not have this procedure? If you are on a blood thinning medication (e.g. Coumadin, Plavix, see list of "Blood Thinners"), or if you have an active infection going on, you should not have the procedure.  If you are taking any blood thinners, please inform your physician.  How should I prepare for this procedure?  Do not eat or drink anything at least six hours prior to the procedure.  Bring a driver with you .  It cannot be a taxi.  Come accompanied by an adult that can drive you back, and that is strong enough to help you if your legs get weak or numb from the local anesthetic.  Take all of your medicines the morning of the procedure with just enough water to swallow them.  If you have diabetes, make sure that you are scheduled to have your procedure done first thing in the morning, whenever possible.  If you have diabetes, take only half of your insulin dose and notify our nurse that you have done so as soon  as you arrive at the clinic.  If you are diabetic, but only take blood sugar pills (oral hypoglycemic), then do not take them on the morning of your procedure.  You may take them after you have had the procedure.  Do not take aspirin or any aspirin-containing medications, at least eleven (11) days prior to the procedure.  They may prolong bleeding.  Wear loose fitting clothing that may be easy to take off and that you would not mind if it got stained with Betadine or blood.  Do not wear any jewelry or perfume  Remove any nail coloring.  It will interfere with some of our monitoring equipment.  NOTE: Remember that this is not meant to be interpreted as a complete list of all possible complications.  Unforeseen problems may occur.  BLOOD THINNERS The following drugs contain aspirin or other  products, which can cause increased bleeding during surgery and should not be taken for 2 weeks prior to and 1 week after surgery.  If you should need take something for relief of minor pain, you may take acetaminophen which is found in Tylenol,m Datril, Anacin-3 and Panadol. It is not blood thinner. The products listed below are.  Do not take any of the products listed below in addition to any listed on your instruction sheet.  A.P.C or A.P.C with Codeine Codeine Phosphate Capsules #3 Ibuprofen Ridaura  ABC compound Congesprin Imuran rimadil  Advil Cope Indocin Robaxisal  Alka-Seltzer Effervescent Pain Reliever and Antacid Coricidin or Coricidin-D  Indomethacin Rufen  Alka-Seltzer plus Cold Medicine Cosprin Ketoprofen S-A-C Tablets  Anacin Analgesic Tablets or Capsules Coumadin Korlgesic Salflex  Anacin Extra Strength Analgesic tablets or capsules CP-2 Tablets Lanoril Salicylate  Anaprox Cuprimine Capsules Levenox Salocol  Anexsia-D Dalteparin Magan Salsalate  Anodynos Darvon compound Magnesium Salicylate Sine-off  Ansaid Dasin Capsules Magsal Sodium Salicylate  Anturane Depen Capsules Marnal Soma   APF Arthritis pain formula Dewitt's Pills Measurin Stanback  Argesic Dia-Gesic Meclofenamic Sulfinpyrazone  Arthritis Bayer Timed Release Aspirin Diclofenac Meclomen Sulindac  Arthritis pain formula Anacin Dicumarol Medipren Supac  Analgesic (Safety coated) Arthralgen Diffunasal Mefanamic Suprofen  Arthritis Strength Bufferin Dihydrocodeine Mepro Compound Suprol  Arthropan liquid Dopirydamole Methcarbomol with Aspirin Synalgos  ASA tablets/Enseals Disalcid Micrainin Tagament  Ascriptin Doan's Midol Talwin  Ascriptin A/D Dolene Mobidin Tanderil  Ascriptin Extra Strength Dolobid Moblgesic Ticlid  Ascriptin with Codeine Doloprin or Doloprin with Codeine Momentum Tolectin  Asperbuf Duoprin Mono-gesic Trendar  Aspergum Duradyne Motrin or Motrin IB Triminicin  Aspirin plain, buffered or enteric coated Durasal Myochrisine Trigesic  Aspirin Suppositories Easprin Nalfon Trillsate  Aspirin with Codeine Ecotrin Regular or Extra Strength Naprosyn Uracel  Atromid-S Efficin Naproxen Ursinus  Auranofin Capsules Elmiron Neocylate Vanquish  Axotal Emagrin Norgesic Verin  Azathioprine Empirin or Empirin with Codeine Normiflo Vitamin E  Azolid Emprazil Nuprin Voltaren  Bayer Aspirin plain, buffered or children's or timed BC Tablets or powders Encaprin Orgaran Warfarin Sodium  Buff-a-Comp Enoxaparin Orudis Zorpin  Buff-a-Comp with Codeine Equegesic Os-Cal-Gesic   Buffaprin Excedrin plain, buffered or Extra Strength Oxalid   Bufferin Arthritis Strength Feldene Oxphenbutazone   Bufferin plain or Extra Strength Feldene Capsules Oxycodone with Aspirin   Bufferin with Codeine Fenoprofen Fenoprofen Pabalate or Pabalate-SF   Buffets II Flogesic Panagesic   Buffinol plain or Extra Strength Florinal or Florinal with Codeine Panwarfarin   Buf-Tabs Flurbiprofen Penicillamine   Butalbital Compound Four-way cold tablets Penicillin   Butazolidin Fragmin Pepto-Bismol   Carbenicillin Geminisyn Percodan   Carna  Arthritis Reliever Geopen Persantine   Carprofen Gold's salt Persistin   Chloramphenicol Goody's Phenylbutazone   Chloromycetin Haltrain Piroxlcam   Clmetidine heparin Plaquenil   Cllnoril Hyco-pap Ponstel   Clofibrate Hydroxy chloroquine Propoxyphen         Before stopping any of these medications, be sure to consult the physician who ordered them.  Some, such as Coumadin (Warfarin) are ordered to prevent or treat serious conditions such as "deep thrombosis", "pumonary embolisms", and other heart problems.  The amount of time that you may need off of the medication may also vary with the medication and the reason for which you were taking it.  If you are taking any of these medications, please make sure you notify your pain physician before you undergo any procedures.         Selective Nerve Root Block Patient Information  Description: Specific nerve  roots exit the spinal canal and these nerves can be compressed and inflamed by a bulging disc and bone spurs.  By injecting steroids on the nerve root, we can potentially decrease the inflammation surrounding these nerves, which often leads to decreased pain.  Also, by injecting local anesthesia on the nerve root, this can provide [place] helpful information to give to your referring doctor if it decreases your pain.  Selective nerve root blocks can be done along the spine from the neck to the low back depending on the location of your pain.   After numbing the skin with local anesthesia, a small needle is passed to the nerve root and the position of the needle is verified using x-ray pictures.  After the needle is in correct position, we then deposit the medication.  You may experience a pressure sensation while this is being done.  The entire block usually lasts less than 15 minutes.  Conditions that may be treated with selective nerve root blocks:  Low back and leg pain  Spinal stenosis  Diagnostic block prior to potential surgery  Neck  and arm pain  Post laminectomy syndrome  Preparation for the injection:  1. Do not eat any solid food or dairy products within 6 hours of your appointment. 2. You may drink clear liquids up to 2 hours before an appointment.  Clear liquids include water, black coffee, juice or soda.  No milk or cream please. 3. You may take your regular medications, including pain medications, with a sip of water before your appointment.  Diabetics should hold regular insulin (if taken separately) and take 1/2 normal NPH dose the morning of the procedure.  Carry some sugar containing items with you to your appointment. 4. A driver must accompany you and be prepared to drive you home after your procedure. 5. Bring all your current medications with you. 6. An IV may be inserted and sedation may be given at the discretion of the physician. 7. A blood pressure cuff, EKG, and other monitors will often be applied during the procedure.  Some patients may need to have extra oxygen administered for a short period. 8. You will be asked to provide medical information, including allergies, prior to the procedure.  We must know immediately if you are taking blood  Thinners (like Coumadin) or if you are allergic to IV iodine contrast (dye).  Possible side-effects: All are usually temporary  Bleeding from needle site  Light headedness  Numbness and tingling  Decreased blood pressure  Weakness in arms/legs  Pressure sensation in back/neck  Pain at injection site (several days)  Possible complications: All are extremely rare  Infection  Nerve injury  Spinal headache (a headache wore with upright position)  Call if you experience:  Fever/chills associated with headache or increased back/neck pain  Headache worsened by an upright position  New onset weakness or numbness of an extremity below the injection site  Hives or difficulty breathing (go to the emergency room)  Inflammation or drainage at the  injection site(s)  Severe back/neck pain greater than usual  New symptoms which are concerning to you  Please note:  Although the local anesthetic injected can often make your back or neck feel good for several hours after the injection the pain will likely return.  It takes 3-5 days for steroids to work on the nerve root. You may not notice any pain relief for at least one week.  If effective, we will often do a series of 3 injections  spaced 3-6 weeks apart to maximally decrease your pain.    If you have any questions, please call [phone] [facility]

## 2015-01-07 NOTE — Progress Notes (Signed)
   Subjective:    Patient ID: Michelle Conway, female    DOB: 1960/08/23, 54 y.o.   MRN: [id number]  HPI Patient is 54 year old female who [facility] evaluation and treatment of pain involving the lower back lower extremity region. Patient stated that previous lumbosacral selective nerve root block provided excellent relief of pain of the lower extremities. Patient states she has had return of significant pain of the left knee. Patient is status post left and right total knee replacement. Patient also complained of muscle spasms of the mid and lower back region. We will begin Norflex and will continue patient's hydrocodone acetaminophen and Neurontin and schedule patient for umbo sacral selective nerve root block to be performed at time return appointment in attempt to continue to control severity of patient's burning stinging throbbing sensations of the lower extremities. Patient was understanding and in agreement status treatment plan.   Review of Systems     Objective:   Physical Exam  There was tenderness over the splenius capitis and occipitalis musculature region of mild degree with mild tinnitus of the acromial clavicular glenohumeral joint region. Patient was with bilaterally equal grip strength Tinel and Phalen's maneuver were without increased pain of significant degree. There was significant muscle spasms noted thoracic paraspinal musculature region without crepitus of the thoracic region noted. There was tends to palpation over the region of the lumbar facet lumbar paraspinal musculature region as well as the gluteal and piriformis musculature region of mild degree. There was straight leg raising tolerates approximately 30 without increased pain with dorsiflexion noted. There was negative clonus negative Homans. Knees were with well-healed surgical scars of the knees without increased warmth or erythema in the region of the knees noted. There was tends to  palpation of the knees with areas of hypersensitivity to touch with no definite allodynia noted. There was negative clonus negative Homans. Abdomen nontender with no costovertebral angle tenderness noted.      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 Rebecka Apley for evaliation of  BP and general medical  condition  F/U surgical evaluation as discussed  F/U neurological evaluation. May consider pending follow-up evaluations  May consider radiofrequency rhizolysis or intraspinal procedures pending response to present treatment and F/U evaluation   Patient to call [facility] should patient have concerns prior to scheduled return appointment.

## 2015-01-07 NOTE — Progress Notes (Signed)
Safety precautions to be maintained throughout the outpatient stay will include: orient to surroundings, keep bed in low position, maintain call bell within reach at all times, provide assistance with transfer out of bed and ambulation.  

## 2015-02-03 ENCOUNTER — Ambulatory Visit: Payer: Managed Care, Other (non HMO) | Attending: Pain Medicine | Admitting: Pain Medicine

## 2015-02-03 ENCOUNTER — Encounter: Payer: Self-pay | Admitting: Pain Medicine

## 2015-02-03 VITALS — BP 128/94 | HR 60 | Temp 97.8°F | Resp 18 | Ht 63.0 in | Wt 165.0 lb

## 2015-02-03 DIAGNOSIS — M545 Low back pain, unspecified: Secondary | ICD-10-CM

## 2015-02-03 DIAGNOSIS — M79605 Pain in left leg: Secondary | ICD-10-CM | POA: Diagnosis present

## 2015-02-03 DIAGNOSIS — Z96653 Presence of artificial knee joint, bilateral: Secondary | ICD-10-CM

## 2015-02-03 DIAGNOSIS — M5416 Radiculopathy, lumbar region: Secondary | ICD-10-CM

## 2015-02-03 DIAGNOSIS — M5136 Other intervertebral disc degeneration, lumbar region: Secondary | ICD-10-CM | POA: Insufficient documentation

## 2015-02-03 DIAGNOSIS — G9050 Complex regional pain syndrome I, unspecified: Secondary | ICD-10-CM

## 2015-02-03 DIAGNOSIS — M47896 Other spondylosis, lumbar region: Secondary | ICD-10-CM | POA: Diagnosis not present

## 2015-02-03 DIAGNOSIS — M79604 Pain in right leg: Secondary | ICD-10-CM | POA: Diagnosis present

## 2015-02-03 MED ORDER — ORPHENADRINE CITRATE ER 100 MG PO TB12: ORAL_TABLET | ORAL | Status: DC

## 2015-02-03 MED ORDER — MIDAZOLAM HCL 5 MG/5ML IJ SOLN
INTRAMUSCULAR | Status: AC
  Administered 2015-02-03: 5 mg via INTRAVENOUS
  Filled 2015-02-03: qty 5

## 2015-02-03 MED ORDER — CEFUROXIME AXETIL 250 MG PO TABS: 250.0000 mg | ORAL_TABLET | Freq: Two times a day (BID) | ORAL | Status: DC

## 2015-02-03 MED ORDER — LACTATED RINGERS IV SOLN: 1000.0000 mL | INTRAVENOUS | Status: DC

## 2015-02-03 MED ORDER — TRIAMCINOLONE ACETONIDE 40 MG/ML IJ SUSP
INTRAMUSCULAR | Status: AC
  Administered 2015-02-03: 40 mg
  Filled 2015-02-03: qty 1

## 2015-02-03 MED ORDER — TRIAMCINOLONE ACETONIDE 40 MG/ML IJ SUSP
40.0000 mg | Freq: Once | INTRAMUSCULAR | Status: AC
  Administered 2015-02-03: 40 mg

## 2015-02-03 MED ORDER — CEFAZOLIN SODIUM 1 G IJ SOLR
INTRAMUSCULAR | Status: AC
  Administered 2015-02-03: 08:00:00
  Filled 2015-02-03: qty 10

## 2015-02-03 MED ORDER — BUPIVACAINE HCL (PF) 0.25 % IJ SOLN
INTRAMUSCULAR | Status: AC
  Filled 2015-02-03: qty 30

## 2015-02-03 MED ORDER — LIDOCAINE HCL (PF) 1 % IJ SOLN: 10.0000 mL | Freq: Once | INTRAMUSCULAR | Status: DC

## 2015-02-03 MED ORDER — FENTANYL CITRATE (PF) 100 MCG/2ML IJ SOLN
INTRAMUSCULAR | Status: AC
  Administered 2015-02-03: 100 ug via INTRAVENOUS
  Filled 2015-02-03: qty 2

## 2015-02-03 MED ORDER — ORPHENADRINE CITRATE 30 MG/ML IJ SOLN
INTRAMUSCULAR | Status: AC
  Administered 2015-02-03: 08:00:00
  Filled 2015-02-03: qty 2

## 2015-02-03 MED ORDER — MIDAZOLAM HCL 5 MG/5ML IJ SOLN
5.0000 mg | Freq: Once | INTRAMUSCULAR | Status: AC
  Administered 2015-02-03: 5 mg via INTRAVENOUS

## 2015-02-03 MED ORDER — BUPIVACAINE HCL (PF) 0.25 % IJ SOLN
30.0000 mL | Freq: Once | INTRAMUSCULAR | Status: AC
  Administered 2015-02-03: 08:00:00

## 2015-02-03 MED ORDER — LIDOCAINE HCL (CARDIAC) 20 MG/ML IV SOLN
INTRAVENOUS | Status: AC
  Administered 2015-02-03: 08:00:00
  Filled 2015-02-03: qty 5

## 2015-02-03 MED ORDER — GABAPENTIN 300 MG PO CAPS: ORAL_CAPSULE | ORAL | Status: DC

## 2015-02-03 MED ORDER — HYDROCODONE-ACETAMINOPHEN 5-325 MG PO TABS: ORAL_TABLET | ORAL | Status: DC

## 2015-02-03 NOTE — Progress Notes (Signed)
Safety precautions to be maintained throughout the outpatient stay will include: orient to surroundings, keep bed in low position, maintain call bell within reach at all times, provide assistance with transfer out of bed and ambulation.  

## 2015-02-03 NOTE — Progress Notes (Signed)
Subjective:    Patient ID: Michelle Conway, female    DOB: 02-14-1961, 54 y.o.   MRN: [id number]  HPI PROCEDURE PERFORMED: Lumbosacral selective nerve root block   NOTE: The patient is a 54 y.o. female who returns to [place] for further evaluation and treatment of pain involving the lumbar and lower extremity region. 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 There is concern regarding patient's lower extremity pain being due to component of complex regional pain syndrome. The patient is status post total knee replacement on the left and on the right. The patient has had more significant relief of pain with lumbosacral selective nerve root block instead of lumbar sympathetic blocks. We will proceed with lumbosacral selective nerve root block in attempt to decrease severity of symptoms, minimize progression of symptoms, and avoid the need for more involved treatment. The patient is undergone surgical evaluation by her surgeon who performed total knee replacement who has informed patient to continue treatment and [facility]. The patient continues to benefit significantly from lumbosacral selective nerve root blocks. We will proceed with lumbosacral selective nerve root blocks. There is concern regarding component of pain being due to sympathetically mediated pain as well as sympathetic independent pain contributing to the patient's symptomatology. The risks, benefits, and expectations of the procedure have been explained to the patient who was understanding and in agreement with suggested treatment plan. We will proceed with interventional treatment as discussed and as explained to the patient. The patient is understanding and in agreement with suggested treatment plan.   DESCRIPTION OF PROCEDURE: Lumbosacral selective  nerve root block with IV Versed, IV fentanyl conscious sedation, EKG, blood pressure, pulse, and pulse oximetry monitoring. The procedure was performed with the patient in the prone position under fluoroscopic guidance. With the patient in the prone position, Betadine prep of proposed entry site was performed. Local anesthetic skin wheal of proposed needle entry site was prepared with 1.5% plain lidocaine with AP view of the lumbosacral spine.   PROCEDURE #1: Needle placement at the left L 2 vertebral body: A 22 -gauge needle was inserted at the inferior border of the transverse process of the vertebral body with needle placed medial to the midline of the transverse process on AP view of the lumbosacral spine.   NEEDLE PLACEMENT AT  L3, L4, and L5  VERTEBRAL BODY LEVELS  Needle  placement was accomplished at L3, L4, and L5  vertebral body levels on the left side exactly as was accomplished at the L2  vertebral body level  and utilizing the same technique and under fluoroscopic guidance.    Needle placement was then verified on lateral view at all levels with needle tip documented to be in the posterior superior quadrant of the intervertebral foramen of  L 2, L3, L4, and L5. Following negative aspiration for heme and CSF at each level, each level was injected with 3 mL of 0.25% bupivacaine with Kenalog.   LUMBOSACRAL SELECTIVE NERVE ROOT BLOCKS THE THE  RIGHT SIDE  The procedure was performed on the right side exactly as was performed on the left side and at the same levels  Under fluoroscopic guidance and utilizing the same technique.    The patient tolerated the procedure well. A total of 10 mg of Kenalog was utilized for the procedure.   PLAN:  1. Medications: Will continue  presently prescribed medications. 2. The patient is to undergo follow-up evaluation with PCP Dr. Rebecka Apley for evaluation of blood pressure and general medical condition status post procedure performed on today's  visit. 3. Surgical follow-up evaluation. Patient will undergo follow-up surgical evaluation as discussed and as needed 4. Neurological evaluation. May consider pending follow-up evaluation May consider radiofrequency procedures, implantation type procedures and other treatment pending response to treatment and follow-up evaluation. 5. The patient has been advised to adhere to proper body mechanics and avoid activities which may aggravate condition. 6. The patient has been advised to call the [facility] prior to scheduled return appointment should there be significant change in the patient's condition or should the patient have other concerns regarding condition prior to scheduled return appointment.       Review of Systems     Objective:   Physical Exam        Assessment & Plan:

## 2015-02-03 NOTE — Patient Instructions (Addendum)
Continue present medication Neurontin Norflex and hydrocodone acetaminophen and begin taking antibiotic Ceftin today as prescribed  F/U PCP Dr. Rebecka Apley for evaliation of  BP and general medical  condition  F/U surgical evaluation as discussed  F/U neurological evaluation. May consider pending follow-up evaluations  May consider radiofrequency rhizolysis or intraspinal procedures pending response to present treatment and F/U evaluation   Patient to call Pain Management Center should patient have concerns prior to scheduled return appointment.GENERAL RISKS AND COMPLICATIONS  What are the risk, side effects and possible complications? Generally speaking, most procedures are safe.  However, with any procedure there are risks, side effects, and the possibility of complications.  The risks and complications are dependent upon the sites that are lesioned, or the type of nerve block to be performed.  The closer the procedure is to the spine, the more serious the risks are.  Great care is taken when placing the radio frequency needles, block needles or lesioning probes, but sometimes complications can occur. 1. Infection: Any time there is an injection through the skin, there is a risk of infection.  This is why sterile conditions are used for these blocks.  There are four possible types of infection. 1. Localized skin infection. 2. Central Nervous System Infection-This can be in the form of Meningitis, which can be deadly. 3. Epidural Infections-This can be in the form of an epidural abscess, which can cause pressure inside of the spine, causing compression of the spinal cord with subsequent paralysis. This would require an emergency surgery to decompress, and there are no guarantees that the patient would recover from the paralysis. 4. Discitis-This is an infection of the intervertebral discs.  It occurs in about 1% of discography procedures.  It is difficult to treat and it may lead to surgery.         2. Pain: the needles have to go through skin and soft tissues, will cause soreness.       3. Damage to internal structures:  The nerves to be lesioned may be near blood vessels or    other nerves which can be potentially damaged.       4. Bleeding: Bleeding is more common if the patient is taking blood thinners such as  aspirin, Coumadin, Ticiid, Plavix, etc., or if he/she have some genetic predisposition  such as hemophilia. Bleeding into the spinal canal can cause compression of the spinal  cord with subsequent paralysis.  This would require an emergency surgery to  decompress and there are no guarantees that the patient would recover from the  paralysis.       5. Pneumothorax:  Puncturing of a lung is a possibility, every time a needle is introduced in  the area of the chest or upper back.  Pneumothorax refers to free air around the  collapsed lung(s), inside of the thoracic cavity (chest cavity).  Another two possible  complications related to a similar event would include: Hemothorax and Chylothorax.   These are variations of the Pneumothorax, where instead of air around the collapsed  lung(s), you may have blood or chyle, respectively.       6. Spinal headaches: They may occur with any procedures in the area of the spine.       7. Persistent CSF (Cerebro-Spinal Fluid) leakage: This is a rare problem, but may occur  with prolonged intrathecal or epidural catheters either due to the formation of a fistulous  track or a dural tear.       8.  Nerve damage: By working so close to the spinal cord, there is always a possibility of  nerve damage, which could be as serious as a permanent spinal cord injury with  paralysis.       9. Death:  Although rare, severe deadly allergic reactions known as "Anaphylactic  reaction" can occur to any of the medications used.      10. Worsening of the symptoms:  We can always make thing worse.  What are the chances of something like this happening? Chances of any of this  occuring are extremely low.  By statistics, you have more of a chance of getting killed in a motor vehicle accident: while driving to the hospital than any of the above occurring .  Nevertheless, you should be aware that they are possibilities.  In general, it is similar to taking a shower.  Everybody knows that you can slip, hit your head and get killed.  Does that mean that you should not shower again?  Nevertheless always keep in mind that statistics do not mean anything if you happen to be on the wrong side of them.  Even if a procedure has a 1 (one) in a 1,000,000 (million) chance of going wrong, it you happen to be that one..Also, keep in mind that by statistics, you have more of a chance of having something go wrong when taking medications.  Who should not have this procedure? If you are on a blood thinning medication (e.g. Coumadin, Plavix, see list of "Blood Thinners"), or if you have an active infection going on, you should not have the procedure.  If you are taking any blood thinners, please inform your physician.  How should I prepare for this procedure?  Do not eat or drink anything at least six hours prior to the procedure.  Bring a driver with you .  It cannot be a taxi.  Come accompanied by an adult that can drive you back, and that is strong enough to help you if your legs get weak or numb from the local anesthetic.  Take all of your medicines the morning of the procedure with just enough water to swallow them.  If you have diabetes, make sure that you are scheduled to have your procedure done first thing in the morning, whenever possible.  If you have diabetes, take only half of your insulin dose and notify our nurse that you have done so as soon as you arrive at the clinic.  If you are diabetic, but only take blood sugar pills (oral hypoglycemic), then do not take them on the morning of your procedure.  You may take them after you have had the procedure.  Do not take aspirin  or any aspirin-containing medications, at least eleven (11) days prior to the procedure.  They may prolong bleeding.  Wear loose fitting clothing that may be easy to take off and that you would not mind if it got stained with Betadine or blood.  Do not wear any jewelry or perfume  Remove any nail coloring.  It will interfere with some of our monitoring equipment.  NOTE: Remember that this is not meant to be interpreted as a complete list of all possible complications.  Unforeseen problems may occur.  BLOOD THINNERS The following drugs contain aspirin or other products, which can cause increased bleeding during surgery and should not be taken for 2 weeks prior to and 1 week after surgery.  If you should need take something for relief of minor pain,  you may take acetaminophen which is found in Tylenol,m Datril, Anacin-3 and Panadol. It is not blood thinner. The products listed below are.  Do not take any of the products listed below in addition to any listed on your instruction sheet.  A.P.C or A.P.C with Codeine Codeine Phosphate Capsules #3 Ibuprofen Ridaura  ABC compound Congesprin Imuran rimadil  Advil Cope Indocin Robaxisal  Alka-Seltzer Effervescent Pain Reliever and Antacid Coricidin or Coricidin-D  Indomethacin Rufen  Alka-Seltzer plus Cold Medicine Cosprin Ketoprofen S-A-C Tablets  Anacin Analgesic Tablets or Capsules Coumadin Korlgesic Salflex  Anacin Extra Strength Analgesic tablets or capsules CP-2 Tablets Lanoril Salicylate  Anaprox Cuprimine Capsules Levenox Salocol  Anexsia-D Dalteparin Magan Salsalate  Anodynos Darvon compound Magnesium Salicylate Sine-off  Ansaid Dasin Capsules Magsal Sodium Salicylate  Anturane Depen Capsules Marnal Soma  APF Arthritis pain formula Dewitt's Pills Measurin Stanback  Argesic Dia-Gesic Meclofenamic Sulfinpyrazone  Arthritis Bayer Timed Release Aspirin Diclofenac Meclomen Sulindac  Arthritis pain formula Anacin Dicumarol Medipren Supac   Analgesic (Safety coated) Arthralgen Diffunasal Mefanamic Suprofen  Arthritis Strength Bufferin Dihydrocodeine Mepro Compound Suprol  Arthropan liquid Dopirydamole Methcarbomol with Aspirin Synalgos  ASA tablets/Enseals Disalcid Micrainin Tagament  Ascriptin Doan's Midol Talwin  Ascriptin A/D Dolene Mobidin Tanderil  Ascriptin Extra Strength Dolobid Moblgesic Ticlid  Ascriptin with Codeine Doloprin or Doloprin with Codeine Momentum Tolectin  Asperbuf Duoprin Mono-gesic Trendar  Aspergum Duradyne Motrin or Motrin IB Triminicin  Aspirin plain, buffered or enteric coated Durasal Myochrisine Trigesic  Aspirin Suppositories Easprin Nalfon Trillsate  Aspirin with Codeine Ecotrin Regular or Extra Strength Naprosyn Uracel  Atromid-S Efficin Naproxen Ursinus  Auranofin Capsules Elmiron Neocylate Vanquish  Axotal Emagrin Norgesic Verin  Azathioprine Empirin or Empirin with Codeine Normiflo Vitamin E  Azolid Emprazil Nuprin Voltaren  Bayer Aspirin plain, buffered or children's or timed BC Tablets or powders Encaprin Orgaran Warfarin Sodium  Buff-a-Comp Enoxaparin Orudis Zorpin  Buff-a-Comp with Codeine Equegesic Os-Cal-Gesic   Buffaprin Excedrin plain, buffered or Extra Strength Oxalid   Bufferin Arthritis Strength Feldene Oxphenbutazone   Bufferin plain or Extra Strength Feldene Capsules Oxycodone with Aspirin   Bufferin with Codeine Fenoprofen Fenoprofen Pabalate or Pabalate-SF   Buffets II Flogesic Panagesic   Buffinol plain or Extra Strength Florinal or Florinal with Codeine Panwarfarin   Buf-Tabs Flurbiprofen Penicillamine   Butalbital Compound Four-way cold tablets Penicillin   Butazolidin Fragmin Pepto-Bismol   Carbenicillin Geminisyn Percodan   Carna Arthritis Reliever Geopen Persantine   Carprofen Gold's salt Persistin   Chloramphenicol Goody's Phenylbutazone   Chloromycetin Haltrain Piroxlcam   Clmetidine heparin Plaquenil   Cllnoril Hyco-pap Ponstel   Clofibrate Hydroxy  chloroquine Propoxyphen         Before stopping any of these medications, be sure to consult the physician who ordered them.  Some, such as Coumadin (Warfarin) are ordered to prevent or treat serious conditions such as "deep thrombosis", "pumonary embolisms", and other heart problems.  The amount of time that you may need off of the medication may also vary with the medication and the reason for which you were taking it.  If you are taking any of these medications, please make sure you notify your pain physician before you undergo any procedures.         Pain Management Discharge Instructions  General Discharge Instructions :  If you need to reach your doctor call: Monday-Friday 8:00 am - 4:00 pm at [phone] or toll free [phone].  After clinic hours [phone] to have operator reach doctor.  Bring all of your  medication bottles to all your appointments in the pain clinic.  To cancel or reschedule your appointment with Pain Management please remember to call 24 hours in advance to avoid a fee.  Refer to the educational materials which you have been given on: General Risks, I had my Procedure. Discharge Instructions, Post Sedation.  Post Procedure Instructions:  The drugs you were given will stay in your system until tomorrow, so for the next 24 hours you should not drive, make any legal decisions or drink any alcoholic beverages.  You may eat anything you prefer, but it is better to start with liquids then soups and crackers, and gradually work up to solid foods.  Please notify your doctor immediately if you have any unusual bleeding, trouble breathing or pain that is not related to your normal pain.  Depending on the type of procedure that was done, some parts of your body may feel week and/or numb.  This usually clears up by tonight or the next day.  Walk with the use of an assistive device or accompanied by an adult for the 24 hours.  You may use ice on the affected  area for the first 24 hours.  Put ice in a Ziploc bag and cover with a towel and place against area 15 minutes on 15 minutes off.  You may switch to heat after 24 hours.

## 2015-02-04 ENCOUNTER — Telehealth: Payer: Self-pay | Admitting: *Deleted

## 2015-02-04 NOTE — Telephone Encounter (Signed)
Patient states she is doing good.  Verbalizes no c/o following procedure on yesterday.

## 2015-03-04 ENCOUNTER — Ambulatory Visit: Payer: Managed Care, Other (non HMO) | Attending: Pain Medicine | Admitting: Pain Medicine

## 2015-03-04 ENCOUNTER — Encounter: Payer: Self-pay | Admitting: Pain Medicine

## 2015-03-04 VITALS — BP 124/75 | HR 74 | Temp 98.1°F | Resp 16 | Ht 63.0 in | Wt 168.0 lb

## 2015-03-04 DIAGNOSIS — M545 Low back pain, unspecified: Secondary | ICD-10-CM

## 2015-03-04 DIAGNOSIS — G90523 Complex regional pain syndrome I of lower limb, bilateral: Secondary | ICD-10-CM | POA: Insufficient documentation

## 2015-03-04 DIAGNOSIS — M5416 Radiculopathy, lumbar region: Secondary | ICD-10-CM

## 2015-03-04 DIAGNOSIS — M5136 Other intervertebral disc degeneration, lumbar region: Secondary | ICD-10-CM | POA: Insufficient documentation

## 2015-03-04 DIAGNOSIS — M7060 Trochanteric bursitis, unspecified hip: Secondary | ICD-10-CM | POA: Insufficient documentation

## 2015-03-04 DIAGNOSIS — M25551 Pain in right hip: Secondary | ICD-10-CM | POA: Diagnosis present

## 2015-03-04 DIAGNOSIS — M47816 Spondylosis without myelopathy or radiculopathy, lumbar region: Secondary | ICD-10-CM | POA: Insufficient documentation

## 2015-03-04 DIAGNOSIS — Z96653 Presence of artificial knee joint, bilateral: Secondary | ICD-10-CM | POA: Insufficient documentation

## 2015-03-04 DIAGNOSIS — G9050 Complex regional pain syndrome I, unspecified: Secondary | ICD-10-CM

## 2015-03-04 DIAGNOSIS — M25562 Pain in left knee: Secondary | ICD-10-CM | POA: Diagnosis present

## 2015-03-04 MED ORDER — ORPHENADRINE CITRATE ER 100 MG PO TB12: ORAL_TABLET | ORAL | Status: DC

## 2015-03-04 MED ORDER — HYDROCODONE-ACETAMINOPHEN 5-325 MG PO TABS: ORAL_TABLET | ORAL | Status: DC

## 2015-03-04 NOTE — Progress Notes (Signed)
Safety precautions to be maintained throughout the outpatient stay will include: orient to surroundings, keep bed in low position, maintain call bell within reach at all times, provide assistance with transfer out of bed and ambulation.    Patient here for medication refill and post procedure evaluation

## 2015-03-04 NOTE — Progress Notes (Signed)
Subjective:    Patient ID: Michelle Conway, female    DOB: 01/31/1961, 54 y.o.   MRN: [id number]  HPI  The patient is a 54 year old female who returns to pain management center for further evaluation and treatment of pain involving the lower back lower extremity region especially the knees. The patient is status post surgery of the knees with left and right total knee replacements. There has been concern regarding patient being with significant component of pain due to complex regional pain syndrome status post total knee replacements on the left as well as on the right. Patient states that she has tremendous relief of pain following treatment in pain management Center consisting of lumbosacral selective nerve root blocks. We will proceed with lumbosacral selective nerve root block to be performed at time return appointment in attempt to decrease severity of patient's symptoms, minimize progression of symptoms, and avoid need for more involved treatment. The patient was understanding and agreed with suggested treatment plan. The patient may also benefit from geniculate nerve blocks of the knees and may be a candidate for radio freaks rhizolysis of the geniculate nerves. We will May consider such treatment pending further evaluation and assessment of patient. We will continue Neurontin and hydrocodone as prescribed which has been helpful in terms of reducing patient's pain is well. The patient agreed to suggested treatment plan.     Review of Systems     Objective:   Physical Exam There was tenderness to palpation of paraspinal must reason cervical region cervical facet region of minimal degree. There was minimal tense to palpation over the splenius capitis and occipitalis musculature regions. Patient over the region of the thoracic facet thoracic paraspinal musculature region was with minimal tenderness to palpation. Patient appeared to be with bilaterally equal grip strength and Tinel and Phalen's  maneuver were without increased pain of significant degree. Patient appeared to be unremarkable Spurling's maneuver. Palpation over the thoracic facet thoracic paraspinal musculature region was attends to palpation of minimal degree. No crepitus of the thoracic region was noted. Palpation over the lumbar paraspinal musculature region lumbar facet region was attends to palpation of moderate degree with moderate increase of pain with palpation over the PSIS and P IIS regions. Palpation of the greater trochanteric region iliotibial band region reproduced moderate discomfort as well. Straight leg raise was tolerates approximately 20 without increased pain with dorsiflexion noted. There were well-healed surgical scars of the left and right knees with the right knee. Larger than the left knee. There were areas of increased sensitivity to touch of both the left and right lower extremities some areas suggestive of allodynia. Patient appeared to be with decreased EHL strength without a definite sensory deficit or dermatomal distribution detected. There was negative clonus negative Homans. Abdomen nontender with no costovertebral tenderness noted.       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 Rebecka Apley for evaliation of  BP and general medical  condition  F/U surgical evaluation as discussed  F/U neurological evaluation. May consider pending follow-up evaluations  May consider radiofrequency rhizolysis  or intraspinal procedures  pending response to present treatment and F/U evaluation   Patient to call Pain Management Center should patient have concerns prior to scheduled return appointment.

## 2015-03-04 NOTE — Patient Instructions (Addendum)
PLAN   Continue present medication Neurontin Norflex and hydrocodone acetaminophen   Lumbosacral selective nerve root block to be performed at time of return appointment  F/U PCP Dr. Rebecka Apley for evaliation of  BP and general medical  condition  F/U surgical evaluation as discussed  F/U neurological evaluation. May consider pending follow-up evaluations  May consider radiofrequency rhizolysis or intraspinal procedures pending response to present treatment and F/U evaluation   Patient to call Pain Management Center should patient have concerns prior to scheduled return appointment.  Selective Nerve Root Block Patient Information  Description: Specific nerve roots exit the spinal canal and these nerves can be compressed and inflamed by a bulging disc and bone spurs.  By injecting steroids on the nerve root, we can potentially decrease the inflammation surrounding these nerves, which often leads to decreased pain.  Also, by injecting local anesthesia on the nerve root, this can provide [place] helpful information to give to your referring doctor if it decreases your pain.  Selective nerve root blocks can be done along the spine from the neck to the low back depending on the location of your pain.   After numbing the skin with local anesthesia, a small needle is passed to the nerve root and the position of the needle is verified using x-ray pictures.  After the needle is in correct position, we then deposit the medication.  You may experience a pressure sensation while this is being done.  The entire block usually lasts less than 15 minutes.  Conditions that may be treated with selective nerve root blocks:  Low back and leg pain  Spinal stenosis  Diagnostic block prior to potential surgery  Neck and arm pain  Post laminectomy syndrome  Preparation for the injection:  1. Do not eat any solid food or dairy products within 6 hours of your appointment. 2. You may drink clear liquids up to 2  hours before an appointment.  Clear liquids include water, black coffee, juice or soda.  No milk or cream please. 3. You may take your regular medications, including pain medications, with a sip of water before your appointment.  Diabetics should hold regular insulin (if taken separately) and take 1/2 normal NPH dose the morning of the procedure.  Carry some sugar containing items with you to your appointment. 4. A driver must accompany you and be prepared to drive you home after your procedure. 5. Bring all your current medications with you. 6. An IV may be inserted and sedation may be given at the discretion of the physician. 7. A blood pressure cuff, EKG, and other monitors will often be applied during the procedure.  Some patients may need to have extra oxygen administered for a short period. 8. You will be asked to provide medical information, including allergies, prior to the procedure.  We must know immediately if you are taking blood  Thinners (like Coumadin) or if you are allergic to IV iodine contrast (dye).  Possible side-effects: All are usually temporary  Bleeding from needle site  Light headedness  Numbness and tingling  Decreased blood pressure  Weakness in arms/legs  Pressure sensation in back/neck  Pain at injection site (several days)  Possible complications: All are extremely rare  Infection  Nerve injury  Spinal headache (a headache wore with upright position)  Call if you experience:  Fever/chills associated with headache or increased back/neck pain  Headache worsened by an upright position  New onset weakness or numbness of an extremity below the injection site  Hives or difficulty breathing (go to the emergency room)  Inflammation or drainage at the injection site(s)  Severe back/neck pain greater than usual  New symptoms which are concerning to you  Please note:  Although the local anesthetic injected can often make your back or neck feel  good for several hours after the injection the pain will likely return.  It takes 3-5 days for steroids to work on the nerve root. You may not notice any pain relief for at least one week.  If effective, we will often do a series of 3 injections spaced 3-6 weeks apart to maximally decrease your pain.    If you have any questions, please call [phone] [facility]

## 2015-03-19 ENCOUNTER — Other Ambulatory Visit: Payer: Self-pay | Admitting: Pain Medicine

## 2015-03-31 ENCOUNTER — Ambulatory Visit: Payer: Managed Care, Other (non HMO) | Attending: Pain Medicine | Admitting: Pain Medicine

## 2015-03-31 ENCOUNTER — Encounter: Payer: Self-pay | Admitting: Pain Medicine

## 2015-03-31 VITALS — BP 121/56 | HR 63 | Temp 98.1°F | Resp 18 | Ht 63.0 in | Wt 165.0 lb

## 2015-03-31 DIAGNOSIS — M5416 Radiculopathy, lumbar region: Secondary | ICD-10-CM

## 2015-03-31 DIAGNOSIS — M545 Low back pain, unspecified: Secondary | ICD-10-CM

## 2015-03-31 DIAGNOSIS — G9050 Complex regional pain syndrome I, unspecified: Secondary | ICD-10-CM

## 2015-03-31 DIAGNOSIS — M5136 Other intervertebral disc degeneration, lumbar region: Secondary | ICD-10-CM | POA: Diagnosis not present

## 2015-03-31 DIAGNOSIS — M79605 Pain in left leg: Secondary | ICD-10-CM | POA: Diagnosis present

## 2015-03-31 DIAGNOSIS — M79604 Pain in right leg: Secondary | ICD-10-CM | POA: Diagnosis present

## 2015-03-31 DIAGNOSIS — Z96653 Presence of artificial knee joint, bilateral: Secondary | ICD-10-CM | POA: Diagnosis not present

## 2015-03-31 MED ORDER — CEFAZOLIN SODIUM 1 G IJ SOLR
INTRAMUSCULAR | Status: AC
  Administered 2015-03-31: 1 g
  Filled 2015-03-31: qty 10

## 2015-03-31 MED ORDER — CEFAZOLIN SODIUM 1-5 GM-% IV SOLN: 1.0000 g | Freq: Once | INTRAVENOUS | Status: DC

## 2015-03-31 MED ORDER — MIDAZOLAM HCL 5 MG/5ML IJ SOLN
5.0000 mg | Freq: Once | INTRAMUSCULAR | Status: AC
  Administered 2015-03-31: 5 mg via INTRAVENOUS

## 2015-03-31 MED ORDER — BUPIVACAINE HCL (PF) 0.25 % IJ SOLN
30.0000 mL | Freq: Once | INTRAMUSCULAR | Status: AC
  Administered 2015-03-31: 09:00:00

## 2015-03-31 MED ORDER — FENTANYL CITRATE (PF) 100 MCG/2ML IJ SOLN
INTRAMUSCULAR | Status: AC
  Administered 2015-03-31: 100 ug via INTRAVENOUS
  Filled 2015-03-31: qty 2

## 2015-03-31 MED ORDER — FENTANYL CITRATE (PF) 100 MCG/2ML IJ SOLN
100.0000 ug | Freq: Once | INTRAMUSCULAR | Status: AC
  Administered 2015-03-31: 100 ug via INTRAVENOUS

## 2015-03-31 MED ORDER — ORPHENADRINE CITRATE 30 MG/ML IJ SOLN
INTRAMUSCULAR | Status: AC
  Administered 2015-03-31: 60 mg via INTRAMUSCULAR
  Filled 2015-03-31: qty 2

## 2015-03-31 MED ORDER — LIDOCAINE HCL (PF) 1 % IJ SOLN: 10.0000 mL | Freq: Once | INTRAMUSCULAR | Status: DC

## 2015-03-31 MED ORDER — ORPHENADRINE CITRATE 30 MG/ML IJ SOLN
60.0000 mg | Freq: Once | INTRAMUSCULAR | Status: AC
  Administered 2015-03-31: 60 mg via INTRAMUSCULAR

## 2015-03-31 MED ORDER — BUPIVACAINE HCL (PF) 0.25 % IJ SOLN
INTRAMUSCULAR | Status: AC
  Filled 2015-03-31: qty 30

## 2015-03-31 MED ORDER — LIDOCAINE HCL (PF) 1 % IJ SOLN
INTRAMUSCULAR | Status: AC
  Filled 2015-03-31: qty 5

## 2015-03-31 MED ORDER — CEFUROXIME AXETIL 250 MG PO TABS: 250.0000 mg | ORAL_TABLET | Freq: Two times a day (BID) | ORAL | Status: DC

## 2015-03-31 MED ORDER — HYDROCODONE-ACETAMINOPHEN 5-325 MG PO TABS: ORAL_TABLET | ORAL | Status: DC

## 2015-03-31 MED ORDER — MIDAZOLAM HCL 5 MG/5ML IJ SOLN
INTRAMUSCULAR | Status: AC
  Administered 2015-03-31: 5 mg via INTRAVENOUS
  Filled 2015-03-31: qty 5

## 2015-03-31 MED ORDER — TRIAMCINOLONE ACETONIDE 40 MG/ML IJ SUSP
40.0000 mg | Freq: Once | INTRAMUSCULAR | Status: AC
  Administered 2015-03-31: 40 mg

## 2015-03-31 MED ORDER — LACTATED RINGERS IV SOLN: 1000.0000 mL | INTRAVENOUS | Status: DC

## 2015-03-31 MED ORDER — TRIAMCINOLONE ACETONIDE 40 MG/ML IJ SUSP
INTRAMUSCULAR | Status: AC
  Administered 2015-03-31: 40 mg
  Filled 2015-03-31: qty 1

## 2015-03-31 MED ORDER — ORPHENADRINE CITRATE ER 100 MG PO TB12: ORAL_TABLET | ORAL | Status: DC

## 2015-03-31 NOTE — Progress Notes (Signed)
Safety precautions to be maintained throughout the outpatient stay will include: orient to surroundings, keep bed in low position, maintain call bell within reach at all times, provide assistance with transfer out of bed and ambulation.  

## 2015-03-31 NOTE — Progress Notes (Signed)
Subjective:    Patient ID: Michelle Conway, female    DOB: [birth year], 54 y.o.   MRN: [id number]  HPI  PROCEDURE PERFORMED: Lumbosacral selective nerve root block   NOTE: The patient is a 54 y.o. female who returns to [place] for further evaluation and treatment of pain involving the lumbar and lower extremity region. The patient is status post left and right total knee replacements and there is concern regarding component of complex regional pain syndrome contributing to patient's symptomatology. The patient has had significant relief of pain following lumbosacral selective nerve root blocks versus the lumbar sympathetic blocks. 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 The risks, benefits, and expectations of the procedure have been explained to the patient who was understanding and in agreement with suggested treatment plan. We will proceed with interventional treatment as discussed and as explained to the patient. The patient is understanding and in agreement with suggested treatment plan.   DESCRIPTION OF PROCEDURE: Lumbosacral selective nerve root block with IV Versed, IV fentanyl conscious sedation, EKG, blood pressure, pulse, and pulse oximetry monitoring. The procedure was performed with the patient in the prone position under fluoroscopic guidance. With the patient in the  prone position, Betadine prep of proposed entry site was performed. Local anesthetic skin wheal of proposed needle entry site was prepared with 1.5% plain lidocaine with AP view of the lumbosacral spine.   PROCEDURE #1: Needle placement at the left L 2 vertebral body: A 22 -gauge needle was inserted at the inferior border of the transverse process of the vertebral body with needle placed medial to the midline of the transverse process on AP view of the lumbosacral spine.   NEEDLE PLACEMENT AT  L3, L4, and L5  VERTEBRAL BODY LEVELS  Needle  placement was accomplished at L3, L4, and L5  vertebral body levels on the left side exactly as was accomplished at the L2  vertebral body level  and utilizing the same technique and under fluoroscopic guidance.   Needle placement was then verified on lateral view at all levels with needle tip documented to be in the posterior superior quadrant of the intervertebral foramen of  L 2, L3, L4, and L5. Following negative aspiration for heme and CSF at each level, each level was injected with 3 mL of 0.25% bupivacaine with Kenalog.   LUMBOSACRAL SELECTIVE NERVE ROOT BLOCKS THE THE  RIGHT SIDE  The procedure was performed on the right side exactly as was performed on the left side and at the same levels  Under fluoroscopic guidance and utilizing the same technique.    The patient tolerated the procedure well. A total of 10 mg of Kenalog was utilized for the procedure.   PLAN:  1. Medications: Will continue presently prescribed medications. Neurontin and hydrocodone acetaminophen 2. The patient is to undergo follow-up evaluation with PCP Dr.Masoud   for evaluation of blood pressure and general medical condition status post procedure performed on today's visit. 3. Surgical follow-up evaluation.Patient  will follow-up with Dr. Christella Hartigan as needed. Dr.  Christella Hartigan performed bilateral total knee replacements palpation   4. Neurological evaluation. Has been addressed and  will be considered  5. May consider radiofrequency procedures, implantation type procedures and other treatment pending response to treatment and follow-up evaluation. 6. The patient has been advise do adhere to proper body mechanics and avoid activities which may aggravate condition. 7. The patient has been advised to call the Pain Management Center prior to scheduled return appointment should there be significant change in the patient's condition or should the patient have other concerns regarding condition prior to scheduled return appointment.   Review of Systems     Objective:   Physical Exam        Assessment & Plan:

## 2015-03-31 NOTE — Patient Instructions (Addendum)
Continue present medication Neurontin Norflex and hydrocodone acetaminophen and begin taking antibiotic Ceftin today as prescribed  F/U PCP Dr. Rebecka Apley for evaliation of  BP and general medical  condition  F/U surgical evaluation as discussed  F/U neurological evaluation. May consider pending follow-up evaluations  May consider radiofrequency rhizolysis or intraspinal procedures pending response to present treatment and F/U evaluation   Patient to call Pain Management Center should patient have concerns prior to scheduled return appointmentPain Management Discharge Instructions  General Discharge Instructions :  If you need to reach your doctor call: Monday-Friday 8:00 am - 4:00 pm at [phone] or toll free [phone].  After clinic hours [phone] to have operator reach doctor.  Bring all of your medication bottles to all your appointments in the pain clinic.  To cancel or reschedule your appointment with Pain Management please remember to call 24 hours in advance to avoid a fee.  Refer to the educational materials which you have been given on: General Risks, I had my Procedure. Discharge Instructions, Post Sedation.  Post Procedure Instructions:  The drugs you were given will stay in your system until tomorrow, so for the next 24 hours you should not drive, make any legal decisions or drink any alcoholic beverages.  You may eat anything you prefer, but it is better to start with liquids then soups and crackers, and gradually work up to solid foods.  Please notify your doctor immediately if you have any unusual bleeding, trouble breathing or pain that is not related to your normal pain.  Depending on the type of procedure that was done, some parts of your body may feel week and/or numb.  This usually clears up by tonight or the next day.  Walk with the use of an assistive device or accompanied by an adult for the 24 hours.  You may use ice on the affected area for the  first 24 hours.  Put ice in a Ziploc bag and cover with a towel and place against area 15 minutes on 15 minutes off.  You may switch to heat after 24 hours.GENERAL RISKS AND COMPLICATIONS  What are the risk, side effects and possible complications? Generally speaking, most procedures are safe.  However, with any procedure there are risks, side effects, and the possibility of complications.  The risks and complications are dependent upon the sites that are lesioned, or the type of nerve block to be performed.  The closer the procedure is to the spine, the more serious the risks are.  Great care is taken when placing the radio frequency needles, block needles or lesioning probes, but sometimes complications can occur. 1. Infection: Any time there is an injection through the skin, there is a risk of infection.  This is why sterile conditions are used for these blocks.  There are four possible types of infection. 1. Localized skin infection. 2. Central Nervous System Infection-This can be in the form of Meningitis, which can be deadly. 3. Epidural Infections-This can be in the form of an epidural abscess, which can cause pressure inside of the spine, causing compression of the spinal cord with subsequent paralysis. This would require an emergency surgery to decompress, and there are no guarantees that the patient would recover from the paralysis. 4. Discitis-This is an infection of the intervertebral discs.  It occurs in about 1% of discography procedures.  It is difficult to treat and it may lead to surgery.        2. Pain: the needles have to go  through skin and soft tissues, will cause soreness.       3. Damage to internal structures:  The nerves to be lesioned may be near blood vessels or    other nerves which can be potentially damaged.       4. Bleeding: Bleeding is more common if the patient is taking blood thinners such as  aspirin, Coumadin, Ticiid, Plavix, etc., or if he/she have some genetic  predisposition  such as hemophilia. Bleeding into the spinal canal can cause compression of the spinal  cord with subsequent paralysis.  This would require an emergency surgery to  decompress and there are no guarantees that the patient would recover from the  paralysis.       5. Pneumothorax:  Puncturing of a lung is a possibility, every time a needle is introduced in  the area of the chest or upper back.  Pneumothorax refers to free air around the  collapsed lung(s), inside of the thoracic cavity (chest cavity).  Another two possible  complications related to a similar event would include: Hemothorax and Chylothorax.   These are variations of the Pneumothorax, where instead of air around the collapsed  lung(s), you may have blood or chyle, respectively.       6. Spinal headaches: They may occur with any procedures in the area of the spine.       7. Persistent CSF (Cerebro-Spinal Fluid) leakage: This is a rare problem, but may occur  with prolonged intrathecal or epidural catheters either due to the formation of a fistulous  track or a dural tear.       8. Nerve damage: By working so close to the spinal cord, there is always a possibility of  nerve damage, which could be as serious as a permanent spinal cord injury with  paralysis.       9. Death:  Although rare, severe deadly allergic reactions known as "Anaphylactic  reaction" can occur to any of the medications used.      10. Worsening of the symptoms:  We can always make thing worse.  What are the chances of something like this happening? Chances of any of this occuring are extremely low.  By statistics, you have more of a chance of getting killed in a motor vehicle accident: while driving to the hospital than any of the above occurring .  Nevertheless, you should be aware that they are possibilities.  In general, it is similar to taking a shower.  Everybody knows that you can slip, hit your head and get killed.  Does that mean that you should not  shower again?  Nevertheless always keep in mind that statistics do not mean anything if you happen to be on the wrong side of them.  Even if a procedure has a 1 (one) in a 1,000,000 (million) chance of going wrong, it you happen to be that one..Also, keep in mind that by statistics, you have more of a chance of having something go wrong when taking medications.  Who should not have this procedure? If you are on a blood thinning medication (e.g. Coumadin, Plavix, see list of "Blood Thinners"), or if you have an active infection going on, you should not have the procedure.  If you are taking any blood thinners, please inform your physician.  How should I prepare for this procedure?  Do not eat or drink anything at least six hours prior to the procedure.  Bring a driver with you .  It cannot be  a taxi.  Come accompanied by an adult that can drive you back, and that is strong enough to help you if your legs get weak or numb from the local anesthetic.  Take all of your medicines the morning of the procedure with just enough water to swallow them.  If you have diabetes, make sure that you are scheduled to have your procedure done first thing in the morning, whenever possible.  If you have diabetes, take only half of your insulin dose and notify our nurse that you have done so as soon as you arrive at the clinic.  If you are diabetic, but only take blood sugar pills (oral hypoglycemic), then do not take them on the morning of your procedure.  You may take them after you have had the procedure.  Do not take aspirin or any aspirin-containing medications, at least eleven (11) days prior to the procedure.  They may prolong bleeding.  Wear loose fitting clothing that may be easy to take off and that you would not mind if it got stained with Betadine or blood.  Do not wear any jewelry or perfume  Remove any nail coloring.  It will interfere with some of our monitoring equipment.  NOTE: Remember that  this is not meant to be interpreted as a complete list of all possible complications.  Unforeseen problems may occur.  BLOOD THINNERS The following drugs contain aspirin or other products, which can cause increased bleeding during surgery and should not be taken for 2 weeks prior to and 1 week after surgery.  If you should need take something for relief of minor pain, you may take acetaminophen which is found in Tylenol,m Datril, Anacin-3 and Panadol. It is not blood thinner. The products listed below are.  Do not take any of the products listed below in addition to any listed on your instruction sheet.  A.P.C or A.P.C with Codeine Codeine Phosphate Capsules #3 Ibuprofen Ridaura  ABC compound Congesprin Imuran rimadil  Advil Cope Indocin Robaxisal  Alka-Seltzer Effervescent Pain Reliever and Antacid Coricidin or Coricidin-D  Indomethacin Rufen  Alka-Seltzer plus Cold Medicine Cosprin Ketoprofen S-A-C Tablets  Anacin Analgesic Tablets or Capsules Coumadin Korlgesic Salflex  Anacin Extra Strength Analgesic tablets or capsules CP-2 Tablets Lanoril Salicylate  Anaprox Cuprimine Capsules Levenox Salocol  Anexsia-D Dalteparin Magan Salsalate  Anodynos Darvon compound Magnesium Salicylate Sine-off  Ansaid Dasin Capsules Magsal Sodium Salicylate  Anturane Depen Capsules Marnal Soma  APF Arthritis pain formula Dewitt's Pills Measurin Stanback  Argesic Dia-Gesic Meclofenamic Sulfinpyrazone  Arthritis Bayer Timed Release Aspirin Diclofenac Meclomen Sulindac  Arthritis pain formula Anacin Dicumarol Medipren Supac  Analgesic (Safety coated) Arthralgen Diffunasal Mefanamic Suprofen  Arthritis Strength Bufferin Dihydrocodeine Mepro Compound Suprol  Arthropan liquid Dopirydamole Methcarbomol with Aspirin Synalgos  ASA tablets/Enseals Disalcid Micrainin Tagament  Ascriptin Doan's Midol Talwin  Ascriptin A/D Dolene Mobidin Tanderil  Ascriptin Extra Strength Dolobid Moblgesic Ticlid  Ascriptin with Codeine  Doloprin or Doloprin with Codeine Momentum Tolectin  Asperbuf Duoprin Mono-gesic Trendar  Aspergum Duradyne Motrin or Motrin IB Triminicin  Aspirin plain, buffered or enteric coated Durasal Myochrisine Trigesic  Aspirin Suppositories Easprin Nalfon Trillsate  Aspirin with Codeine Ecotrin Regular or Extra Strength Naprosyn Uracel  Atromid-S Efficin Naproxen Ursinus  Auranofin Capsules Elmiron Neocylate Vanquish  Axotal Emagrin Norgesic Verin  Azathioprine Empirin or Empirin with Codeine Normiflo Vitamin E  Azolid Emprazil Nuprin Voltaren  Bayer Aspirin plain, buffered or children's or timed BC Tablets or powders Encaprin Orgaran Warfarin Sodium  Buff-a-Comp Enoxaparin  Orudis Zorpin  Buff-a-Comp with Codeine Equegesic Os-Cal-Gesic   Buffaprin Excedrin plain, buffered or Extra Strength Oxalid   Bufferin Arthritis Strength Feldene Oxphenbutazone   Bufferin plain or Extra Strength Feldene Capsules Oxycodone with Aspirin   Bufferin with Codeine Fenoprofen Fenoprofen Pabalate or Pabalate-SF   Buffets II Flogesic Panagesic   Buffinol plain or Extra Strength Florinal or Florinal with Codeine Panwarfarin   Buf-Tabs Flurbiprofen Penicillamine   Butalbital Compound Four-way cold tablets Penicillin   Butazolidin Fragmin Pepto-Bismol   Carbenicillin Geminisyn Percodan   Carna Arthritis Reliever Geopen Persantine   Carprofen Gold's salt Persistin   Chloramphenicol Goody's Phenylbutazone   Chloromycetin Haltrain Piroxlcam   Clmetidine heparin Plaquenil   Cllnoril Hyco-pap Ponstel   Clofibrate Hydroxy chloroquine Propoxyphen         Before stopping any of these medications, be sure to consult the physician who ordered them.  Some, such as Coumadin (Warfarin) are ordered to prevent or treat serious conditions such as "deep thrombosis", "pumonary embolisms", and other heart problems.  The amount of time that you may need off of the medication may also vary with the medication and the reason for which  you were taking it.  If you are taking any of these medications, please make sure you notify your pain physician before you undergo any procedures.  Hydrocodone prescription given.

## 2015-04-01 ENCOUNTER — Telehealth: Payer: Self-pay | Admitting: *Deleted

## 2015-04-01 NOTE — Telephone Encounter (Signed)
Left voicemail to call our office with any questions or concerns from procedure on yesterday.

## 2015-05-06 ENCOUNTER — Ambulatory Visit: Payer: Managed Care, Other (non HMO) | Attending: Pain Medicine | Admitting: Pain Medicine

## 2015-05-06 ENCOUNTER — Encounter: Payer: Self-pay | Admitting: Pain Medicine

## 2015-05-06 VITALS — BP 114/62 | HR 72 | Temp 98.5°F | Resp 18 | Ht 63.0 in | Wt 163.0 lb

## 2015-05-06 DIAGNOSIS — Z96653 Presence of artificial knee joint, bilateral: Secondary | ICD-10-CM | POA: Diagnosis not present

## 2015-05-06 DIAGNOSIS — M5136 Other intervertebral disc degeneration, lumbar region: Secondary | ICD-10-CM | POA: Diagnosis not present

## 2015-05-06 DIAGNOSIS — M5416 Radiculopathy, lumbar region: Secondary | ICD-10-CM

## 2015-05-06 DIAGNOSIS — M47816 Spondylosis without myelopathy or radiculopathy, lumbar region: Secondary | ICD-10-CM | POA: Diagnosis not present

## 2015-05-06 DIAGNOSIS — M545 Low back pain, unspecified: Secondary | ICD-10-CM

## 2015-05-06 DIAGNOSIS — G9050 Complex regional pain syndrome I, unspecified: Secondary | ICD-10-CM

## 2015-05-06 DIAGNOSIS — M7060 Trochanteric bursitis, unspecified hip: Secondary | ICD-10-CM | POA: Insufficient documentation

## 2015-05-06 DIAGNOSIS — G90523 Complex regional pain syndrome I of lower limb, bilateral: Secondary | ICD-10-CM | POA: Insufficient documentation

## 2015-05-06 DIAGNOSIS — M79606 Pain in leg, unspecified: Secondary | ICD-10-CM | POA: Diagnosis present

## 2015-05-06 MED ORDER — ORPHENADRINE CITRATE ER 100 MG PO TB12
ORAL_TABLET | ORAL | Status: DC
Start: 2015-05-06 — End: 2015-06-29

## 2015-05-06 MED ORDER — HYDROCODONE-ACETAMINOPHEN 5-325 MG PO TABS: ORAL_TABLET | ORAL | Status: DC

## 2015-05-06 MED ORDER — GABAPENTIN 300 MG PO CAPS: ORAL_CAPSULE | ORAL | Status: DC

## 2015-05-06 NOTE — Progress Notes (Signed)
Safety precautions to be maintained throughout the outpatient stay will include: orient to surroundings, keep bed in low position, maintain call bell within reach at all times, provide assistance with transfer out of bed and ambulation.  

## 2015-05-06 NOTE — Progress Notes (Signed)
Subjective:    Patient ID: Michelle Conway, female    DOB: 10/12/1960, 55 y.o.   MRN: [id number]  HPI  The patient is a 55 year old female who returns to pain management for further evaluation and treatment of pain involving the lower back and lower extremity region. Patient status post total knee replacement on the left as well as on the right. The patient has had burning stinging throbbing sharp shooting pains occurring in the lower extremities since her surgery consisting of total knee replacement. There is concern regarding component of complex regional pain syndrome contributing to patient's symptomatology with less concern regarding radicular pain and discogenic pain with radicular for. Patient's pain also felt to be due to neuralgias muscle spasms in addition to component of complex regional pain syndrome. We will proceed with lumbosacral selective nerve root block at time return appointment in attempt to decrease severity of symptoms, minimize progression of symptoms, and avoid the need for more involved treatment. The patient states that she has significant relief of pain following lumbosacral selective nerve root blocks compared to lumbar synthetic blocks. The patient will continue Neurontin and hydrocodone acetaminophen as well as Norflex as discussed and we will consider modification of treatment pending follow-up evaluation. All agreed to suggested treatment plan      Review of Systems     Objective:   Physical Exam There was tenderness of the splenius capitis and occipitalis musculature regions palpation which reproduces mild discomfort with mild tenderness of the acromioclavicular and glenohumeral joint region. Patient appeared to be with bilaterally equal grip strength and Tinel and Phalen's maneuver were without increase of pain of significant degree. There appeared to be mild tenderness of the acromioclavicular and glenohumeral joint region and patient appeared to be unremarkable  Spurling's maneuver. Palpation over the region of the thoracic facet thoracic paraspinal must reason was with evidence of muscle spasms of moderate degree of the lower thoracic region. Palpation over the lumbar paraspinal must reason lumbar facet region was with moderate tends to palpation with moderate to severe tends to palpation noted in the lower lumbar regions. There was tenderness over the PSIS and PII S region a moderate degree and moderate tenderness of the greater trochanteric region and iliotibial band region. Straight leg raising was limited to 20 without an increase of pain with dorsiflexion noted. There were well-healed surgical scars of the knees without increased warmth and erythema in the region of the scars. There was tenderness to palpation of the knees and lower extremities with some areas suggestive of allodynia. EHL strength appeared to be decreased. There was no definite sensory deficit or dermatomal speech detected. There was negative clonus negative Homans. 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 Rebecka Apley for evaliation of  BP and general medical  condition  F/U surgical evaluation as discussed  F/U neurological evaluation. May consider pending follow-up evaluations  May consider radiofrequency rhizolysis or intraspinal procedures pending response to present treatment and F/U evaluation  Patient to call Pain Management  Center should patient have concerns prior to scheduled return appointment.

## 2015-05-06 NOTE — Patient Instructions (Addendum)
PLAN   Continue present medication Neurontin Norflex and hydrocodone acetaminophen   Lumbosacral selective nerve root block to be performed at time of return appointment  F/U PCP Dr. Rebecka Apley for evaliation of  BP and general medical  condition  F/U surgical evaluation as discussed  F/U neurological evaluation. May consider pending follow-up evaluations  May consider radiofrequency rhizolysis or intraspinal procedures pending response to present treatment and F/U evaluation   Patient to call Pain Management Center should patient have concerns prior to scheduled return appointment.Pain Management Discharge Instructions  General Discharge Instructions :  If you need to reach your doctor call: Monday-Friday 8:00 am - 4:00 pm at [phone] or toll free [phone].  After clinic hours [phone] to have operator reach doctor.  Bring all of your medication bottles to all your appointments in the pain clinic.  To cancel or reschedule your appointment with Pain Management please remember to call 24 hours in advance to avoid a fee.  Refer to the educational materials which you have been given on: General Risks, I had my Procedure. Discharge Instructions, Post Sedation.  Post Procedure Instructions:  The drugs you were given will stay in your system until tomorrow, so for the next 24 hours you should not drive, make any legal decisions or drink any alcoholic beverages.  You may eat anything you prefer, but it is better to start with liquids then soups and crackers, and gradually work up to solid foods.  Please notify your doctor immediately if you have any unusual bleeding, trouble breathing or pain that is not related to your normal pain.  Depending on the type of procedure that was done, some parts of your body may feel week and/or numb.  This usually clears up by tonight or the next day.  Walk with the use of an assistive device or accompanied by an adult for the 24 hours.  You  may use ice on the affected area for the first 24 hours.  Put ice in a Ziploc bag and cover with a towel and place against area 15 minutes on 15 minutes off.  You may switch to heat after 24 hours.Selective Nerve Root Block Patient Information  Description: Specific nerve roots exit the spinal canal and these nerves can be compressed and inflamed by a bulging disc and bone spurs.  By injecting steroids on the nerve root, we can potentially decrease the inflammation surrounding these nerves, which often leads to decreased pain.  Also, by injecting local anesthesia on the nerve root, this can provide [place] helpful information to give to your referring doctor if it decreases your pain.  Selective nerve root blocks can be done along the spine from the neck to the low back depending on the location of your pain.   After numbing the skin with local anesthesia, a small needle is passed to the nerve root and the position of the needle is verified using x-ray pictures.  After the needle is in correct position, we then deposit the medication.  You may experience a pressure sensation while this is being done.  The entire block usually lasts less than 15 minutes.  Conditions that may be treated with selective nerve root blocks:  Low back and leg pain  Spinal stenosis  Diagnostic block prior to potential surgery  Neck and arm pain  Post laminectomy syndrome  Preparation for the injection:  1. Do not eat any solid food or dairy products within 6 hours of your appointment. 2. You may drink clear liquids  up to 2 hours before an appointment.  Clear liquids include water, black coffee, juice or soda.  No milk or cream please. 3. You may take your regular medications, including pain medications, with a sip of water before your appointment.  Diabetics should hold regular insulin (if taken separately) and take 1/2 normal NPH dose the morning of the procedure.  Carry some sugar containing items with you to your  appointment. 4. A driver must accompany you and be prepared to drive you home after your procedure. 5. Bring all your current medications with you. 6. An IV may be inserted and sedation may be given at the discretion of the physician. 7. A blood pressure cuff, EKG, and other monitors will often be applied during the procedure.  Some patients may need to have extra oxygen administered for a short period. 8. You will be asked to provide medical information, including allergies, prior to the procedure.  We must know immediately if you are taking blood  Thinners (like Coumadin) or if you are allergic to IV iodine contrast (dye).  Possible side-effects: All are usually temporary  Bleeding from needle site  Light headedness  Numbness and tingling  Decreased blood pressure  Weakness in arms/legs  Pressure sensation in back/neck  Pain at injection site (several days)  Possible complications: All are extremely rare  Infection  Nerve injury  Spinal headache (a headache wore with upright position)  Call if you experience:  Fever/chills associated with headache or increased back/neck pain  Headache worsened by an upright position  New onset weakness or numbness of an extremity below the injection site  Hives or difficulty breathing (go to the emergency room)  Inflammation or drainage at the injection site(s)  Severe back/neck pain greater than usual  New symptoms which are concerning to you  Please note:  Although the local anesthetic injected can often make your back or neck feel good for several hours after the injection the pain will likely return.  It takes 3-5 days for steroids to work on the nerve root. You may not notice any pain relief for at least one week.  If effective, we will often do a series of 3 injections spaced 3-6 weeks apart to maximally decrease your pain.    If you have any questions, please call [phone] [facility]  ClinicGENERAL RISKS AND COMPLICATIONS  What are the risk, side effects and possible complications? Generally speaking, most procedures are safe.  However, with any procedure there are risks, side effects, and the possibility of complications.  The risks and complications are dependent upon the sites that are lesioned, or the type of nerve block to be performed.  The closer the procedure is to the spine, the more serious the risks are.  Great care is taken when placing the radio frequency needles, block needles or lesioning probes, but sometimes complications can occur. 1. Infection: Any time there is an injection through the skin, there is a risk of infection.  This is why sterile conditions are used for these blocks.  There are four possible types of infection. 1. Localized skin infection. 2. Central Nervous System Infection-This can be in the form of Meningitis, which can be deadly. 3. Epidural Infections-This can be in the form of an epidural abscess, which can cause pressure inside of the spine, causing compression of the spinal cord with subsequent paralysis. This would require an emergency surgery to decompress, and there are no guarantees that the patient would recover from the  paralysis. 4. Discitis-This is an infection of the intervertebral discs.  It occurs in about 1% of discography procedures.  It is difficult to treat and it may lead to surgery.        2. Pain: the needles have to go through skin and soft tissues, will cause soreness.       3. Damage to internal structures:  The nerves to be lesioned may be near blood vessels or    other nerves which can be potentially damaged.       4. Bleeding: Bleeding is more common if the patient is taking blood thinners such as  aspirin, Coumadin, Ticiid, Plavix, etc., or if he/she have some genetic predisposition  such as hemophilia. Bleeding into the spinal canal can cause compression of the spinal  cord with subsequent paralysis.  This would  require an emergency surgery to  decompress and there are no guarantees that the patient would recover from the  paralysis.       5. Pneumothorax:  Puncturing of a lung is a possibility, every time a needle is introduced in  the area of the chest or upper back.  Pneumothorax refers to free air around the  collapsed lung(s), inside of the thoracic cavity (chest cavity).  Another two possible  complications related to a similar event would include: Hemothorax and Chylothorax.   These are variations of the Pneumothorax, where instead of air around the collapsed  lung(s), you may have blood or chyle, respectively.       6. Spinal headaches: They may occur with any procedures in the area of the spine.       7. Persistent CSF (Cerebro-Spinal Fluid) leakage: This is a rare problem, but may occur  with prolonged intrathecal or epidural catheters either due to the formation of a fistulous  track or a dural tear.       8. Nerve damage: By working so close to the spinal cord, there is always a possibility of  nerve damage, which could be as serious as a permanent spinal cord injury with  paralysis.       9. Death:  Although rare, severe deadly allergic reactions known as "Anaphylactic  reaction" can occur to any of the medications used.      10. Worsening of the symptoms:  We can always make thing worse.  What are the chances of something like this happening? Chances of any of this occuring are extremely low.  By statistics, you have more of a chance of getting killed in a motor vehicle accident: while driving to the hospital than any of the above occurring .  Nevertheless, you should be aware that they are possibilities.  In general, it is similar to taking a shower.  Everybody knows that you can slip, hit your head and get killed.  Does that mean that you should not shower again?  Nevertheless always keep in mind that statistics do not mean anything if you happen to be on the wrong side of them.  Even if a procedure  has a 1 (one) in a 1,000,000 (million) chance of going wrong, it you happen to be that one..Also, keep in mind that by statistics, you have more of a chance of having something go wrong when taking medications.  Who should not have this procedure? If you are on a blood thinning medication (e.g. Coumadin, Plavix, see list of "Blood Thinners"), or if you have an active infection going on, you should not have the procedure.  If you are taking any blood thinners, please inform your physician.  How should I prepare for this procedure?  Do not eat or drink anything at least six hours prior to the procedure.  Bring a driver with you .  It cannot be a taxi.  Come accompanied by an adult that can drive you back, and that is strong enough to help you if your legs get weak or numb from the local anesthetic.  Take all of your medicines the morning of the procedure with just enough water to swallow them.  If you have diabetes, make sure that you are scheduled to have your procedure done first thing in the morning, whenever possible.  If you have diabetes, take only half of your insulin dose and notify our nurse that you have done so as soon as you arrive at the clinic.  If you are diabetic, but only take blood sugar pills (oral hypoglycemic), then do not take them on the morning of your procedure.  You may take them after you have had the procedure.  Do not take aspirin or any aspirin-containing medications, at least eleven (11) days prior to the procedure.  They may prolong bleeding.  Wear loose fitting clothing that may be easy to take off and that you would not mind if it got stained with Betadine or blood.  Do not wear any jewelry or perfume  Remove any nail coloring.  It will interfere with some of our monitoring equipment.  NOTE: Remember that this is not meant to be interpreted as a complete list of all possible complications.  Unforeseen problems may occur.  BLOOD THINNERS The following  drugs contain aspirin or other products, which can cause increased bleeding during surgery and should not be taken for 2 weeks prior to and 1 week after surgery.  If you should need take something for relief of minor pain, you may take acetaminophen which is found in Tylenol,m Datril, Anacin-3 and Panadol. It is not blood thinner. The products listed below are.  Do not take any of the products listed below in addition to any listed on your instruction sheet.  A.P.C or A.P.C with Codeine Codeine Phosphate Capsules #3 Ibuprofen Ridaura  ABC compound Congesprin Imuran rimadil  Advil Cope Indocin Robaxisal  Alka-Seltzer Effervescent Pain Reliever and Antacid Coricidin or Coricidin-D  Indomethacin Rufen  Alka-Seltzer plus Cold Medicine Cosprin Ketoprofen S-A-C Tablets  Anacin Analgesic Tablets or Capsules Coumadin Korlgesic Salflex  Anacin Extra Strength Analgesic tablets or capsules CP-2 Tablets Lanoril Salicylate  Anaprox Cuprimine Capsules Levenox Salocol  Anexsia-D Dalteparin Magan Salsalate  Anodynos Darvon compound Magnesium Salicylate Sine-off  Ansaid Dasin Capsules Magsal Sodium Salicylate  Anturane Depen Capsules Marnal Soma  APF Arthritis pain formula Dewitt's Pills Measurin Stanback  Argesic Dia-Gesic Meclofenamic Sulfinpyrazone  Arthritis Bayer Timed Release Aspirin Diclofenac Meclomen Sulindac  Arthritis pain formula Anacin Dicumarol Medipren Supac  Analgesic (Safety coated) Arthralgen Diffunasal Mefanamic Suprofen  Arthritis Strength Bufferin Dihydrocodeine Mepro Compound Suprol  Arthropan liquid Dopirydamole Methcarbomol with Aspirin Synalgos  ASA tablets/Enseals Disalcid Micrainin Tagament  Ascriptin Doan's Midol Talwin  Ascriptin A/D Dolene Mobidin Tanderil  Ascriptin Extra Strength Dolobid Moblgesic Ticlid  Ascriptin with Codeine Doloprin or Doloprin with Codeine Momentum Tolectin  Asperbuf Duoprin Mono-gesic Trendar  Aspergum Duradyne Motrin or Motrin IB Triminicin  Aspirin  plain, buffered or enteric coated Durasal Myochrisine Trigesic  Aspirin Suppositories Easprin Nalfon Trillsate  Aspirin with Codeine Ecotrin Regular or Extra Strength Naprosyn Uracel  Atromid-S Efficin Naproxen Ursinus  Auranofin Capsules Elmiron Neocylate Vanquish  Axotal Emagrin Norgesic Verin  Azathioprine Empirin or Empirin with Codeine Normiflo Vitamin E  Azolid Emprazil Nuprin Voltaren  Bayer Aspirin plain, buffered or children's or timed BC Tablets or powders Encaprin Orgaran Warfarin Sodium  Buff-a-Comp Enoxaparin Orudis Zorpin  Buff-a-Comp with Codeine Equegesic Os-Cal-Gesic   Buffaprin Excedrin plain, buffered or Extra Strength Oxalid   Bufferin Arthritis Strength Feldene Oxphenbutazone   Bufferin plain or Extra Strength Feldene Capsules Oxycodone with Aspirin   Bufferin with Codeine Fenoprofen Fenoprofen Pabalate or Pabalate-SF   Buffets II Flogesic Panagesic   Buffinol plain or Extra Strength Florinal or Florinal with Codeine Panwarfarin   Buf-Tabs Flurbiprofen Penicillamine   Butalbital Compound Four-way cold tablets Penicillin   Butazolidin Fragmin Pepto-Bismol   Carbenicillin Geminisyn Percodan   Carna Arthritis Reliever Geopen Persantine   Carprofen Gold's salt Persistin   Chloramphenicol Goody's Phenylbutazone   Chloromycetin Haltrain Piroxlcam   Clmetidine heparin Plaquenil   Cllnoril Hyco-pap Ponstel   Clofibrate Hydroxy chloroquine Propoxyphen         Before stopping any of these medications, be sure to consult the physician who ordered them.  Some, such as Coumadin (Warfarin) are ordered to prevent or treat serious conditions such as "deep thrombosis", "pumonary embolisms", and other heart problems.  The amount of time that you may need off of the medication may also vary with the medication and the reason for which you were taking it.  If you are taking any of these medications, please make sure you notify your pain physician before you undergo any  procedures.

## 2015-05-31 ENCOUNTER — Encounter: Payer: Self-pay | Admitting: Pain Medicine

## 2015-05-31 ENCOUNTER — Ambulatory Visit: Payer: Managed Care, Other (non HMO) | Attending: Pain Medicine | Admitting: Pain Medicine

## 2015-05-31 VITALS — BP 131/71 | HR 69 | Temp 99.7°F | Resp 18 | Ht 63.0 in | Wt 165.0 lb

## 2015-05-31 DIAGNOSIS — M5136 Other intervertebral disc degeneration, lumbar region: Secondary | ICD-10-CM | POA: Insufficient documentation

## 2015-05-31 DIAGNOSIS — M79606 Pain in leg, unspecified: Secondary | ICD-10-CM | POA: Diagnosis present

## 2015-05-31 DIAGNOSIS — M545 Low back pain, unspecified: Secondary | ICD-10-CM

## 2015-05-31 DIAGNOSIS — G9050 Complex regional pain syndrome I, unspecified: Secondary | ICD-10-CM

## 2015-05-31 DIAGNOSIS — M47816 Spondylosis without myelopathy or radiculopathy, lumbar region: Secondary | ICD-10-CM | POA: Insufficient documentation

## 2015-05-31 DIAGNOSIS — Z96653 Presence of artificial knee joint, bilateral: Secondary | ICD-10-CM | POA: Insufficient documentation

## 2015-05-31 DIAGNOSIS — M5416 Radiculopathy, lumbar region: Secondary | ICD-10-CM

## 2015-05-31 MED ORDER — FENTANYL CITRATE (PF) 100 MCG/2ML IJ SOLN
100.0000 ug | Freq: Once | INTRAMUSCULAR | Status: AC
  Administered 2015-05-31: 100 ug via INTRAVENOUS

## 2015-05-31 MED ORDER — BUPIVACAINE HCL (PF) 0.25 % IJ SOLN
30.0000 mL | Freq: Once | INTRAMUSCULAR | Status: AC
  Administered 2015-05-31: 30 mL

## 2015-05-31 MED ORDER — BUPIVACAINE HCL (PF) 0.25 % IJ SOLN
INTRAMUSCULAR | Status: AC
  Administered 2015-05-31: 30 mL
  Filled 2015-05-31: qty 30

## 2015-05-31 MED ORDER — HYDROCODONE-ACETAMINOPHEN 5-325 MG PO TABS: ORAL_TABLET | ORAL | Status: DC

## 2015-05-31 MED ORDER — TRIAMCINOLONE ACETONIDE 40 MG/ML IJ SUSP
40.0000 mg | Freq: Once | INTRAMUSCULAR | Status: AC
  Administered 2015-05-31: 40 mg

## 2015-05-31 MED ORDER — CEFUROXIME AXETIL 250 MG PO TABS: 250.0000 mg | ORAL_TABLET | Freq: Two times a day (BID) | ORAL | Status: DC

## 2015-05-31 MED ORDER — MIDAZOLAM HCL 5 MG/5ML IJ SOLN
5.0000 mg | Freq: Once | INTRAMUSCULAR | Status: AC
  Administered 2015-05-31: 5 mg via INTRAVENOUS

## 2015-05-31 MED ORDER — FENTANYL CITRATE (PF) 100 MCG/2ML IJ SOLN
INTRAMUSCULAR | Status: AC
  Filled 2015-05-31: qty 2

## 2015-05-31 MED ORDER — CEFAZOLIN SODIUM 1-5 GM-% IV SOLN: 1.0000 g | Freq: Once | INTRAVENOUS | Status: DC

## 2015-05-31 MED ORDER — ORPHENADRINE CITRATE 30 MG/ML IJ SOLN
60.0000 mg | Freq: Once | INTRAMUSCULAR | Status: AC
  Administered 2015-05-31: 60 mg via INTRAMUSCULAR

## 2015-05-31 MED ORDER — LACTATED RINGERS IV SOLN: 1000.0000 mL | INTRAVENOUS | Status: DC

## 2015-05-31 MED ORDER — TRIAMCINOLONE ACETONIDE 40 MG/ML IJ SUSP
INTRAMUSCULAR | Status: AC
Start: 2015-05-31 — End: 2015-05-31
  Administered 2015-05-31: 40 mg
  Filled 2015-05-31: qty 1

## 2015-05-31 MED ORDER — LIDOCAINE HCL (PF) 1 % IJ SOLN
10.0000 mL | Freq: Once | INTRAMUSCULAR | Status: AC
  Administered 2015-05-31: 10 mL via SUBCUTANEOUS

## 2015-05-31 MED ORDER — MIDAZOLAM HCL 5 MG/5ML IJ SOLN
INTRAMUSCULAR | Status: AC
  Administered 2015-05-31: 5 mg via INTRAVENOUS
  Filled 2015-05-31: qty 5

## 2015-05-31 MED ORDER — ORPHENADRINE CITRATE 30 MG/ML IJ SOLN
INTRAMUSCULAR | Status: AC
Start: 2015-05-31 — End: 2015-05-31
  Administered 2015-05-31: 60 mg via INTRAMUSCULAR
  Filled 2015-05-31: qty 2

## 2015-05-31 MED ORDER — FENTANYL CITRATE (PF) 100 MCG/2ML IJ SOLN
INTRAMUSCULAR | Status: AC
  Administered 2015-05-31: 100 ug via INTRAVENOUS
  Filled 2015-05-31: qty 2

## 2015-05-31 MED ORDER — LIDOCAINE HCL (PF) 1 % IJ SOLN
INTRAMUSCULAR | Status: AC
  Administered 2015-05-31: 10 mL via SUBCUTANEOUS
  Filled 2015-05-31: qty 10

## 2015-05-31 MED ORDER — CEFAZOLIN SODIUM 1 G IJ SOLR
INTRAMUSCULAR | Status: AC
Start: 2015-05-31 — End: 2015-05-31
  Administered 2015-05-31: 1 g
  Filled 2015-05-31: qty 10

## 2015-05-31 MED ORDER — MIDAZOLAM HCL 5 MG/5ML IJ SOLN
INTRAMUSCULAR | Status: AC
  Filled 2015-05-31: qty 5

## 2015-05-31 NOTE — Progress Notes (Signed)
Safety precautions to be maintained throughout the outpatient stay will include: orient to surroundings, keep bed in low position, maintain call bell within reach at all times, provide assistance with transfer out of bed and ambulation.  

## 2015-05-31 NOTE — Patient Instructions (Addendum)
PLAN   Continue present medication Neurontin Norflex and hydrocodone acetaminophen . Please get Ceftin antibiotic today and begin taking Ceftin antibiotic today as prescribed  F/U PCP Dr. Rebecka Apley for evaliation of  BP and general medical  condition  F/U surgical evaluation as discussed  F/U neurological evaluation. May consider pending follow-up evaluations  May consider radiofrequency rhizolysis or intraspinal procedures pending response to present treatment and F/U evaluation   Patient to call Pain Management Center should patient have concerns prior to scheduled return appointment.Pain Management Discharge Instructions  General Discharge Instructions :  If you need to reach your doctor call: Monday-Friday 8:00 am - 4:00 pm at [phone] or toll free [phone].  After clinic hours [phone] to have operator reach doctor.  Bring all of your medication bottles to all your appointments in the pain clinic.  To cancel or reschedule your appointment with Pain Management please remember to call 24 hours in advance to avoid a fee.  Refer to the educational materials which you have been given on: General Risks, I had my Procedure. Discharge Instructions, Post Sedation.  Post Procedure Instructions:  The drugs you were given will stay in your system until tomorrow, so for the next 24 hours you should not drive, make any legal decisions or drink any alcoholic beverages.  You may eat anything you prefer, but it is better to start with liquids then soups and crackers, and gradually work up to solid foods.  Please notify your doctor immediately if you have any unusual bleeding, trouble breathing or pain that is not related to your normal pain.  Depending on the type of procedure that was done, some parts of your body may feel week and/or numb.  This usually clears up by tonight or the next day.  Walk with the use of an assistive device or accompanied by an adult for the 24  hours.  You may use ice on the affected area for the first 24 hours.  Put ice in a Ziploc bag and cover with a towel and place against area 15 minutes on 15 minutes off.  You may switch to heat after 24 hours. Prescriptons given today: Ceftin, Hydrocodone

## 2015-05-31 NOTE — Progress Notes (Signed)
Subjective:    Patient ID: Michelle Conway, female    DOB: 1961-01-11, 55 y.o.   MRN: [id number]  HPI  PROCEDURE PERFORMED: Lumbosacral selective nerve root block   NOTE: The patient is a 55 y.o. female who returns to [facility] for further evaluation and treatment of pain involving the lumbar and lower extremity region. 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 The patient is status post left and right total knee replacement. There is concern regarding pain of the lower extremities being due to component of complex regional pain syndrome. The patient has had more significant relief following somatic blocks lumbosacral selective nerve root block compared to lumbar sympathetic block. Decision has been made to proceed with lumbosacral selective nerve root block in attempt to decrease patient's symptoms most significantly, and avoid progression of patient's symptoms, and avoid the need for more involved treatment. All agreed to suggested treatment plan  The risks, benefits, and expectations of the procedure have been explained to the patient who was understanding and in agreement with suggested treatment plan. We will proceed with interventional treatment as discussed and as explained to the patient. The patient is understanding and in agreement with suggested treatment plan.   DESCRIPTION OF PROCEDURE: Lumbosacral selective nerve root block with IV Versed, IV fentanyl conscious sedation, EKG, blood pressure, pulse, and pulse oximetry monitoring. The procedure was performed with the patient in the prone position under fluoroscopic guidance. With the patient in the prone position, Betadine prep of proposed entry site was performed. Local anesthetic skin wheal of proposed needle entry site was prepared with 1.5%  plain lidocaine with AP view of the lumbosacral spine.   PROCEDURE #1: Needle placement at the left L 2 vertebral body: A 22 -gauge needle was inserted at the inferior border of the transverse process of the vertebral body with needle placed medial to the midline of the transverse process on AP view of the lumbosacral spine.   NEEDLE PLACEMENT AT  L3, L4, and L5  VERTEBRAL BODY LEVELS  Needle  placement was accomplished at L3, L4, and L5  vertebral body levels on the left side exactly as was accomplished at the L2  vertebral body level  and utilizing the same technique and under fluoroscopic guidance.    Needle placement was then verified on lateral view at all levels with needle tip documented to be in the posterior superior quadrant of the intervertebral foramen of  L 2, L3, L4, and L5. Following negative aspiration for heme and CSF at each level, each level was injected with 3 mL of 0.25% bupivacaine with Kenalog.   LUMBOSACRAL SELECTIVE NERVE ROOT BLOCKS THE THE  RIGHT SIDE  The procedure was performed on the right side exactly as was performed on the left side and at the same levels  Under fluoroscopic guidance and utilizing the same technique.    The patient tolerated the procedure well. A total of 10 mg of Kenalog was utilized for the procedure.   PLAN:  1. Medications: Will continue presently prescribed medications Neurontin Norflex and hydrocodone acetaminophen 2. The patient is to undergo follow-up evaluation with PCP Dr. Lavera Guise for evaluation of blood pressure and general medical condition status post procedure performed on today's visit. 3. Surgical follow-up evaluation. Has been addressed 4. Neurological evaluation. May consider [facility] EMG studies and other studies 5. May  consider radiofrequency procedures, implantation type procedures and other treatment pending response to treatment and follow-up evaluation. 6. The patient has been advise do adhere to proper body mechanics and  avoid activities which may aggravate condition. 7. The patient has been advised to call the Pain Management Center prior to scheduled return appointment should there be significant change in the patient's condition or should the patient have other concerns regarding condition prior to scheduled return appointment.   Review of Systems     Objective:   Physical Exam        Assessment & Plan:

## 2015-06-01 ENCOUNTER — Telehealth: Payer: Self-pay | Admitting: *Deleted

## 2015-06-01 NOTE — Telephone Encounter (Signed)
Message left

## 2015-06-19 ENCOUNTER — Other Ambulatory Visit: Payer: Self-pay | Admitting: Pain Medicine

## 2015-06-29 ENCOUNTER — Ambulatory Visit: Payer: Managed Care, Other (non HMO) | Attending: Pain Medicine | Admitting: Pain Medicine

## 2015-06-29 ENCOUNTER — Encounter: Payer: Self-pay | Admitting: Pain Medicine

## 2015-06-29 VITALS — BP 135/60 | HR 75 | Temp 97.9°F | Resp 16 | Ht 63.0 in | Wt 165.0 lb

## 2015-06-29 DIAGNOSIS — M5416 Radiculopathy, lumbar region: Secondary | ICD-10-CM

## 2015-06-29 DIAGNOSIS — G90523 Complex regional pain syndrome I of lower limb, bilateral: Secondary | ICD-10-CM | POA: Diagnosis not present

## 2015-06-29 DIAGNOSIS — M25562 Pain in left knee: Secondary | ICD-10-CM | POA: Diagnosis present

## 2015-06-29 DIAGNOSIS — Z96653 Presence of artificial knee joint, bilateral: Secondary | ICD-10-CM

## 2015-06-29 DIAGNOSIS — M7060 Trochanteric bursitis, unspecified hip: Secondary | ICD-10-CM | POA: Diagnosis not present

## 2015-06-29 DIAGNOSIS — M545 Low back pain, unspecified: Secondary | ICD-10-CM

## 2015-06-29 DIAGNOSIS — M5136 Other intervertebral disc degeneration, lumbar region: Secondary | ICD-10-CM | POA: Insufficient documentation

## 2015-06-29 DIAGNOSIS — M47816 Spondylosis without myelopathy or radiculopathy, lumbar region: Secondary | ICD-10-CM | POA: Diagnosis not present

## 2015-06-29 DIAGNOSIS — M25561 Pain in right knee: Secondary | ICD-10-CM | POA: Diagnosis present

## 2015-06-29 DIAGNOSIS — G9050 Complex regional pain syndrome I, unspecified: Secondary | ICD-10-CM

## 2015-06-29 MED ORDER — ORPHENADRINE CITRATE ER 100 MG PO TB12
ORAL_TABLET | ORAL | Status: DC
Start: 2015-06-29 — End: 2015-09-02

## 2015-06-29 MED ORDER — GABAPENTIN 300 MG PO CAPS: ORAL_CAPSULE | ORAL | Status: DC

## 2015-06-29 MED ORDER — HYDROCODONE-ACETAMINOPHEN 5-325 MG PO TABS: ORAL_TABLET | ORAL | Status: DC

## 2015-06-29 NOTE — Progress Notes (Signed)
Subjective:    Patient ID: Michelle Conway, female    DOB: June 19, 1960, 55 y.o.   MRN: [id number]  HPI  The patient is a 55 year old female who returns to pain management for further evaluation and treatment of pain involving the lower back lower extremity region with pain involving the knees especially. The patient is status post total knee replacement on the left as well as on the right. The patient states that the pain is a burning stinging throbbing sensation that occurs throughout the day and especially at night. The patient states that interventional treatment has been tremendous in terms of reducing her pain and allow her to function on a daily basis. We discussed patient's condition and will schedule patient for orthopedic reevaluation and proceed with lumbosacral selective nerve root block at time return appointment. The patient has more significant benefit from the lumbosacral selective nerve root block compared to the lumbar sympathetic block. The patient also has a history of degenerative changes of the lumbar spine and there has been concern regarding intraspinal abnormalities contributing to patient's symptomatology in addition to component of complex regional pain syndrome of the lower extremity status post total knee replacements. We will continue Neurontin Norflex and hydrocodone acetaminophen All agreed to suggested treatment plan.      Review of Systems     Objective:   Physical Exam  There was tenderness to palpation of the splenius capitis and occipitalis musculature regions of mild degree. There was mild tenderness over the region of the cervical facet cervical paraspinal muscles region. Palpation of the acromioclavicular and glenohumeral joint regions reproduces minimal discomfort. The patient appeared to be with bilaterally equal grip strength and Tinel and Phalen's maneuver were without increase of pain of significant degree. Palpation over the thoracic facet thoracic  paraspinal muscles region was attends to palpation of mild degree of the middle thoracic regions with moderate muscle spasm of the lower thoracic paraspinal muscles region. Palpation over the lumbar paraspinal must reason lumbar facet region was attends to palpation of moderate degree. Lateral bending rotation extension and palpation of the lumbar facets reproduce moderate discomfort. There was moderate tenderness of the greater trochanteric region iliotibial band region. The knees were tenderness to palpation with well-healed scars of the knees noted. No increased warmth or erythema of the knees were noted. There was tenderness to palpation of both the left and right knees with areas of hypersensitivity to touch with some areas of allodynia. Straight leg raising was tolerates approximately 20. EHL strength appeared to be slightly decreased. No definite sensory deficit or dermatomal distribution detected. There was negative clonus negative Homans. Abdomen was nontender with no costovertebral tenderness noted.      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 Rebecka Apley for evaliation of  BP and general medical  condition  F/U surgical evaluation as discussed . Patient to undergo additional orthopedic evaluation of knees at this time as discussed with patient and as explained to patient  F/U neurological evaluation. May consider PNCV/EMG studies and other studies pending follow-up evaluations  Ask the  nurses and Network engineer the  date of your orthopedic evaluation of your total knee replacements  May consider radiofrequency rhizolysis or intraspinal procedures pending response to present treatment and F/U evaluation   Patient to call [facility] should patient have concerns prior to scheduled return appointment.

## 2015-06-29 NOTE — Patient Instructions (Addendum)
PLAN   Continue present medication Neurontin Norflex and hydrocodone acetaminophen   Lumbosacral selective nerve root block to be performed at time of return appointment  F/U PCP Dr. Rebecka Apley for evaliation of  BP and general medical  condition  F/U surgical evaluation as discussed  F/U neurological evaluation. May consider PNCV/EMG studies and other studies pending follow-up evaluations  Ask the nurses and secretary the date of your orthopedic evaluation of your total knee replacements  May consider radiofrequency rhizolysis or intraspinal procedures pending response to present treatment and F/U evaluation   Patient to call [facility] should patient have concerns prior to scheduled return appointment.Selective Nerve Root Block Patient Information  Description: Specific nerve roots exit the spinal canal and these nerves can be compressed and inflamed by a bulging disc and bone spurs.  By injecting steroids on the nerve root, we can potentially decrease the inflammation surrounding these nerves, which often leads to decreased pain.  Also, by injecting local anesthesia on the nerve root, this can provide [place] helpful information to give to your referring doctor if it decreases your pain.  Selective nerve root blocks can be done along the spine from the neck to the low back depending on the location of your pain.   After numbing the skin with local anesthesia, a small needle is passed to the nerve root and the position of the needle is verified using x-ray pictures.  After the needle is in correct position, we then deposit the medication.  You may experience a pressure sensation while this is being done.  The entire block usually lasts less than 15 minutes.  Conditions that may be treated with selective nerve root blocks:  Low back and leg pain  Spinal stenosis  Diagnostic block prior to potential surgery  Neck and arm pain  Post laminectomy syndrome  Preparation for the  injection:  1. Do not eat any solid food or dairy products within 8 hours of your appointment. 2. You may drink clear liquids up to 3 hours before an appointment.  Clear liquids include water, black coffee, juice or soda.  No milk or cream please. 3. You may take your regular medications, including pain medications, with a sip of water before your appointment.  Diabetics should hold regular insulin (if taken separately) and take 1/2 normal NPH dose the morning of the procedure.  Carry some sugar containing items with you to your appointment. 4. A driver must accompany you and be prepared to drive you home after your procedure. 5. Bring all your current medications with you. 6. An IV may be inserted and sedation may be given at the discretion of the physician. 7. A blood pressure cuff, EKG, and other monitors will often be applied during the procedure.  Some patients may need to have extra oxygen administered for a short period. 8. You will be asked to provide medical information, including allergies, prior to the procedure.  We must know immediately if you are taking blood  Thinners (like Coumadin) or if you are allergic to IV iodine contrast (dye).  Possible side-effects: All are usually temporary  Bleeding from needle site  Light headedness  Numbness and tingling  Decreased blood pressure  Weakness in arms/legs  Pressure sensation in back/neck  Pain at injection site (several days)  Possible complications: All are extremely rare  Infection  Nerve injury  Spinal headache (a headache wore with upright position)  Call if you experience:  Fever/chills associated with headache or increased back/neck pain  Headache worsened by an upright position  New onset weakness or numbness of an extremity below the injection site  Hives or difficulty breathing (go to the emergency room)  Inflammation or drainage at the injection site(s)  Severe back/neck pain greater than  usual  New symptoms which are concerning to you  Please note:  Although the local anesthetic injected can often make your back or neck feel good for several hours after the injection the pain will likely return.  It takes 3-5 days for steroids to work on the nerve root. You may not notice any pain relief for at least one week.  If effective, we will often do a series of 3 injections spaced 3-6 weeks apart to maximally decrease your pain.    If you have any questions, please call [phone] [facility] AND COM[facility]S  What are the risk, side effects and possible complications? Generally speaking, most procedures are safe.  However, with any procedure there are risks, side effects, and the possibility of complications.  The risks and complications are dependent upon the sites that are lesioned, or the type of nerve block to be performed.  The closer the procedure is to the spine, the more serious the risks are.  Great care is taken when placing the radio frequency needles, block needles or lesioning probes, but sometimes complications can occur. 1. Infection: Any time there is an injection through the skin, there is a risk of infection.  This is why sterile conditions are used for these blocks.  There are four possible types of infection. 1. Localized skin infection. 2. Central Nervous System Infection-This can be in the form of Meningitis, which can be deadly. 3. Epidural Infections-This can be in the form of an epidural abscess, which can cause pressure inside of the spine, causing compression of the spinal cord with subsequent paralysis. This would require an emergency surgery to decompress, and there are no guarantees that the patient would recover from the paralysis. 4. Discitis-This is an infection of the intervertebral discs.  It occurs in about 1% of discography procedures.  It is difficult to treat and it may lead to surgery.         2. Pain: the needles have to go through skin and soft tissues, will cause soreness.       3. Damage to internal structures:  The nerves to be lesioned may be near blood vessels or    other nerves which can be potentially damaged.       4. Bleeding: Bleeding is more common if the patient is taking blood thinners such as  aspirin, Coumadin, Ticiid, Plavix, etc., or if he/she have some genetic predisposition  such as hemophilia. Bleeding into the spinal canal can cause compression of the spinal  cord with subsequent paralysis.  This would require an emergency surgery to  decompress and there are no guarantees that the patient would recover from the  paralysis.       5. Pneumothorax:  Puncturing of a lung is a possibility, every time a needle is introduced in  the area of the chest or upper back.  Pneumothorax refers to free air around the  collapsed lung(s), inside of the thoracic cavity (chest cavity).  Another two possible  complications related to a similar event would include: Hemothorax and Chylothorax.   These are variations of the Pneumothorax, where instead of air around the collapsed  lung(s), you may have blood or chyle, respectively.  6. Spinal headaches: They may occur with any procedures in the area of the spine.       7. Persistent CSF (Cerebro-Spinal Fluid) leakage: This is a rare problem, but may occur  with prolonged intrathecal or epidural catheters either due to the formation of a fistulous  track or a dural tear.       8. Nerve damage: By working so close to the spinal cord, there is always a possibility of  nerve damage, which could be as serious as a permanent spinal cord injury with  paralysis.       9. Death:  Although rare, severe deadly allergic reactions known as "Anaphylactic  reaction" can occur to any of the medications used.      10. Worsening of the symptoms:  We can always make thing worse.  What are the chances of something like this happening? Chances of any of this  occuring are extremely low.  By statistics, you have more of a chance of getting killed in a motor vehicle accident: while driving to the hospital than any of the above occurring .  Nevertheless, you should be aware that they are possibilities.  In general, it is similar to taking a shower.  Everybody knows that you can slip, hit your head and get killed.  Does that mean that you should not shower again?  Nevertheless always keep in mind that statistics do not mean anything if you happen to be on the wrong side of them.  Even if a procedure has a 1 (one) in a 1,000,000 (million) chance of going wrong, it you happen to be that one..Also, keep in mind that by statistics, you have more of a chance of having something go wrong when taking medications.  Who should not have this procedure? If you are on a blood thinning medication (e.g. Coumadin, Plavix, see list of "Blood Thinners"), or if you have an active infection going on, you should not have the procedure.  If you are taking any blood thinners, please inform your physician.  How should I prepare for this procedure?  Do not eat or drink anything at least six hours prior to the procedure.  Bring a driver with you .  It cannot be a taxi.  Come accompanied by an adult that can drive you back, and that is strong enough to help you if your legs get weak or numb from the local anesthetic.  Take all of your medicines the morning of the procedure with just enough water to swallow them.  If you have diabetes, make sure that you are scheduled to have your procedure done first thing in the morning, whenever possible.  If you have diabetes, take only half of your insulin dose and notify our nurse that you have done so as soon as you arrive at the clinic.  If you are diabetic, but only take blood sugar pills (oral hypoglycemic), then do not take them on the morning of your procedure.  You may take them after you have had the procedure.  Do not take aspirin  or any aspirin-containing medications, at least eleven (11) days prior to the procedure.  They may prolong bleeding.  Wear loose fitting clothing that may be easy to take off and that you would not mind if it got stained with Betadine or blood.  Do not wear any jewelry or perfume  Remove any nail coloring.  It will interfere with some of our monitoring equipment.  NOTE: Remember that this is  not meant to be interpreted as a complete list of all possible complications.  Unforeseen problems may occur.  BLOOD THINNERS The following drugs contain aspirin or other products, which can cause increased bleeding during surgery and should not be taken for 2 weeks prior to and 1 week after surgery.  If you should need take something for relief of minor pain, you may take acetaminophen which is found in Tylenol,m Datril, Anacin-3 and Panadol. It is not blood thinner. The products listed below are.  Do not take any of the products listed below in addition to any listed on your instruction sheet.  A.P.C or A.P.C with Codeine Codeine Phosphate Capsules #3 Ibuprofen Ridaura  ABC compound Congesprin Imuran rimadil  Advil Cope Indocin Robaxisal  Alka-Seltzer Effervescent Pain Reliever and Antacid Coricidin or Coricidin-D  Indomethacin Rufen  Alka-Seltzer plus Cold Medicine Cosprin Ketoprofen S-A-C Tablets  Anacin Analgesic Tablets or Capsules Coumadin Korlgesic Salflex  Anacin Extra Strength Analgesic tablets or capsules CP-2 Tablets Lanoril Salicylate  Anaprox Cuprimine Capsules Levenox Salocol  Anexsia-D Dalteparin Magan Salsalate  Anodynos Darvon compound Magnesium Salicylate Sine-off  Ansaid Dasin Capsules Magsal Sodium Salicylate  Anturane Depen Capsules Marnal Soma  APF Arthritis pain formula Dewitt's Pills Measurin Stanback  Argesic Dia-Gesic Meclofenamic Sulfinpyrazone  Arthritis Bayer Timed Release Aspirin Diclofenac Meclomen Sulindac  Arthritis pain formula Anacin Dicumarol Medipren Supac   Analgesic (Safety coated) Arthralgen Diffunasal Mefanamic Suprofen  Arthritis Strength Bufferin Dihydrocodeine Mepro Compound Suprol  Arthropan liquid Dopirydamole Methcarbomol with Aspirin Synalgos  ASA tablets/Enseals Disalcid Micrainin Tagament  Ascriptin Doan's Midol Talwin  Ascriptin A/D Dolene Mobidin Tanderil  Ascriptin Extra Strength Dolobid Moblgesic Ticlid  Ascriptin with Codeine Doloprin or Doloprin with Codeine Momentum Tolectin  Asperbuf Duoprin Mono-gesic Trendar  Aspergum Duradyne Motrin or Motrin IB Triminicin  Aspirin plain, buffered or enteric coated Durasal Myochrisine Trigesic  Aspirin Suppositories Easprin Nalfon Trillsate  Aspirin with Codeine Ecotrin Regular or Extra Strength Naprosyn Uracel  Atromid-S Efficin Naproxen Ursinus  Auranofin Capsules Elmiron Neocylate Vanquish  Axotal Emagrin Norgesic Verin  Azathioprine Empirin or Empirin with Codeine Normiflo Vitamin E  Azolid Emprazil Nuprin Voltaren  Bayer Aspirin plain, buffered or children's or timed BC Tablets or powders Encaprin Orgaran Warfarin Sodium  Buff-a-Comp Enoxaparin Orudis Zorpin  Buff-a-Comp with Codeine Equegesic Os-Cal-Gesic   Buffaprin Excedrin plain, buffered or Extra Strength Oxalid   Bufferin Arthritis Strength Feldene Oxphenbutazone   Bufferin plain or Extra Strength Feldene Capsules Oxycodone with Aspirin   Bufferin with Codeine Fenoprofen Fenoprofen Pabalate or Pabalate-SF   Buffets II Flogesic Panagesic   Buffinol plain or Extra Strength Florinal or Florinal with Codeine Panwarfarin   Buf-Tabs Flurbiprofen Penicillamine   Butalbital Compound Four-way cold tablets Penicillin   Butazolidin Fragmin Pepto-Bismol   Carbenicillin Geminisyn Percodan   Carna Arthritis Reliever Geopen Persantine   Carprofen Gold's salt Persistin   Chloramphenicol Goody's Phenylbutazone   Chloromycetin Haltrain Piroxlcam   Clmetidine heparin Plaquenil   Cllnoril Hyco-pap Ponstel   Clofibrate Hydroxy  chloroquine Propoxyphen         Before stopping any of these medications, be sure to consult the physician who ordered them.  Some, such as Coumadin (Warfarin) are ordered to prevent or treat serious conditions such as "deep thrombosis", "pumonary embolisms", and other heart problems.  The amount of time that you may need off of the medication may also vary with the medication and the reason for which you were taking it.  If you are taking any of these medications, please make sure  you notify your pain physician before you undergo any procedures.

## 2015-06-29 NOTE — Progress Notes (Signed)
Safety precautions to be maintained throughout the outpatient stay will include: orient to surroundings, keep bed in low position, maintain call bell within reach at all times, provide assistance with transfer out of bed and ambulation.  

## 2015-07-12 ENCOUNTER — Ambulatory Visit: Payer: Managed Care, Other (non HMO) | Admitting: Pain Medicine

## 2015-07-13 ENCOUNTER — Other Ambulatory Visit: Payer: Self-pay | Admitting: Pain Medicine

## 2015-07-26 ENCOUNTER — Encounter: Payer: Self-pay | Admitting: Pain Medicine

## 2015-07-26 ENCOUNTER — Ambulatory Visit: Payer: Managed Care, Other (non HMO) | Attending: Pain Medicine | Admitting: Pain Medicine

## 2015-07-26 VITALS — BP 138/65 | HR 57 | Temp 97.8°F | Resp 24 | Ht 63.0 in | Wt 165.0 lb

## 2015-07-26 DIAGNOSIS — Z96659 Presence of unspecified artificial knee joint: Secondary | ICD-10-CM | POA: Insufficient documentation

## 2015-07-26 DIAGNOSIS — M5136 Other intervertebral disc degeneration, lumbar region: Secondary | ICD-10-CM | POA: Diagnosis not present

## 2015-07-26 DIAGNOSIS — M79606 Pain in leg, unspecified: Secondary | ICD-10-CM | POA: Diagnosis present

## 2015-07-26 DIAGNOSIS — G9050 Complex regional pain syndrome I, unspecified: Secondary | ICD-10-CM

## 2015-07-26 DIAGNOSIS — M47816 Spondylosis without myelopathy or radiculopathy, lumbar region: Secondary | ICD-10-CM | POA: Diagnosis not present

## 2015-07-26 DIAGNOSIS — M545 Low back pain, unspecified: Secondary | ICD-10-CM

## 2015-07-26 DIAGNOSIS — Z96653 Presence of artificial knee joint, bilateral: Secondary | ICD-10-CM

## 2015-07-26 DIAGNOSIS — M5416 Radiculopathy, lumbar region: Secondary | ICD-10-CM

## 2015-07-26 MED ORDER — TRIAMCINOLONE ACETONIDE 40 MG/ML IJ SUSP
INTRAMUSCULAR | Status: AC
  Administered 2015-07-26: 13:00:00
  Filled 2015-07-26: qty 1

## 2015-07-26 MED ORDER — CEFAZOLIN SODIUM 1 G IJ SOLR
INTRAMUSCULAR | Status: AC
  Administered 2015-07-26: 12:00:00
  Filled 2015-07-26: qty 10

## 2015-07-26 MED ORDER — ORPHENADRINE CITRATE 30 MG/ML IJ SOLN
INTRAMUSCULAR | Status: AC
  Administered 2015-07-26: 13:00:00
  Filled 2015-07-26: qty 2

## 2015-07-26 MED ORDER — CEFUROXIME AXETIL 250 MG PO TABS: 250.0000 mg | ORAL_TABLET | Freq: Two times a day (BID) | ORAL | Status: DC

## 2015-07-26 MED ORDER — BUPIVACAINE HCL (PF) 0.25 % IJ SOLN: 30.0000 mL | Freq: Once | INTRAMUSCULAR | Status: DC

## 2015-07-26 MED ORDER — LIDOCAINE HCL (PF) 1 % IJ SOLN: 10.0000 mL | Freq: Once | INTRAMUSCULAR | Status: DC

## 2015-07-26 MED ORDER — ORPHENADRINE CITRATE 30 MG/ML IJ SOLN: 60.0000 mg | Freq: Once | INTRAMUSCULAR | Status: AC

## 2015-07-26 MED ORDER — LACTATED RINGERS IV SOLN: 1000.0000 mL | INTRAVENOUS | Status: DC

## 2015-07-26 MED ORDER — FENTANYL CITRATE (PF) 100 MCG/2ML IJ SOLN
INTRAMUSCULAR | Status: AC
  Administered 2015-07-26: 100 ug
  Filled 2015-07-26: qty 2

## 2015-07-26 MED ORDER — FENTANYL CITRATE (PF) 100 MCG/2ML IJ SOLN: 100.0000 ug | Freq: Once | INTRAMUSCULAR | Status: DC

## 2015-07-26 MED ORDER — TRIAMCINOLONE ACETONIDE 40 MG/ML IJ SUSP: 40.0000 mg | Freq: Once | INTRAMUSCULAR | Status: DC

## 2015-07-26 MED ORDER — BUPIVACAINE HCL (PF) 0.25 % IJ SOLN
INTRAMUSCULAR | Status: AC
  Administered 2015-07-26: 13:00:00
  Filled 2015-07-26: qty 30

## 2015-07-26 MED ORDER — MIDAZOLAM HCL 5 MG/5ML IJ SOLN: 5.0000 mg | Freq: Once | INTRAMUSCULAR | Status: DC

## 2015-07-26 MED ORDER — MIDAZOLAM HCL 5 MG/5ML IJ SOLN
INTRAMUSCULAR | Status: AC
  Administered 2015-07-26: 5 mg
  Filled 2015-07-26: qty 5

## 2015-07-26 MED ORDER — CEFAZOLIN SODIUM 1-5 GM-% IV SOLN: 1.0000 g | Freq: Once | INTRAVENOUS | Status: DC

## 2015-07-26 NOTE — Patient Instructions (Addendum)
PLAN   Continue present medication Neurontin Norflex and hydrocodone acetaminophen . Please get Ceftin antibiotic today and begin taking Ceftin antibiotic today as prescribed  F/U PCP Dr. Rebecka Apley for evaliation of  BP and general medical  condition  F/U surgical evaluation as discussed  F/U neurological evaluation. May consider PNCV/EMG studies and other studies pending follow-up evaluations  May consider radiofrequency rhizolysis or intraspinal procedures pending response to present treatment and F/U evaluation   Patient to call [facility] should patient have concerns prior to scheduled return appointment.GENERAL RISKS AND COMPLICATIONS  What are the risk, side effects and possible complications? Generally speaking, most procedures are safe.  However, with any procedure there are risks, side effects, and the possibility of complications.  The risks and complications are dependent upon the sites that are lesioned, or the type of nerve block to be performed.  The closer the procedure is to the spine, the more serious the risks are.  Great care is taken when placing the radio frequency needles, block needles or lesioning probes, but sometimes complications can occur. 1. Infection: Any time there is an injection through the skin, there is a risk of infection.  This is why sterile conditions are used for these blocks.  There are four possible types of infection. 1. Localized skin infection. 2. Central Nervous System Infection-This can be in the form of Meningitis, which can be deadly. 3. Epidural Infections-This can be in the form of an epidural abscess, which can cause pressure inside of the spine, causing compression of the spinal cord with subsequent paralysis. This would require an emergency surgery to decompress, and there are no guarantees that the patient would recover from the paralysis. 4. Discitis-This is an infection of the intervertebral discs.  It occurs in about 1% of  discography procedures.  It is difficult to treat and it may lead to surgery.        2. Pain: the needles have to go through skin and soft tissues, will cause soreness.       3. Damage to internal structures:  The nerves to be lesioned may be near blood vessels or    other nerves which can be potentially damaged.       4. Bleeding: Bleeding is more common if the patient is taking blood thinners such as  aspirin, Coumadin, Ticiid, Plavix, etc., or if he/she have some genetic predisposition  such as hemophilia. Bleeding into the spinal canal can cause compression of the spinal  cord with subsequent paralysis.  This would require an emergency surgery to  decompress and there are no guarantees that the patient would recover from the  paralysis.       5. Pneumothorax:  Puncturing of a lung is a possibility, every time a needle is introduced in  the area of the chest or upper back.  Pneumothorax refers to free air around the  collapsed lung(s), inside of the thoracic cavity (chest cavity).  Another two possible  complications related to a similar event would include: Hemothorax and Chylothorax.   These are variations of the Pneumothorax, where instead of air around the collapsed  lung(s), you may have blood or chyle, respectively.       6. Spinal headaches: They may occur with any procedures in the area of the spine.       7. Persistent CSF (Cerebro-Spinal Fluid) leakage: This is a rare problem, but may occur  with prolonged intrathecal or epidural catheters either due to the formation of a  fistulous  track or a dural tear.       8. Nerve damage: By working so close to the spinal cord, there is always a possibility of  nerve damage, which could be as serious as a permanent spinal cord injury with  paralysis.       9. Death:  Although rare, severe deadly allergic reactions known as "Anaphylactic  reaction" can occur to any of the medications used.      10. Worsening of the symptoms:  We can always make thing  worse.  What are the chances of something like this happening? Chances of any of this occuring are extremely low.  By statistics, you have more of a chance of getting killed in a motor vehicle accident: while driving to the hospital than any of the above occurring .  Nevertheless, you should be aware that they are possibilities.  In general, it is similar to taking a shower.  Everybody knows that you can slip, hit your head and get killed.  Does that mean that you should not shower again?  Nevertheless always keep in mind that statistics do not mean anything if you happen to be on the wrong side of them.  Even if a procedure has a 1 (one) in a 1,000,000 (million) chance of going wrong, it you happen to be that one..Also, keep in mind that by statistics, you have more of a chance of having something go wrong when taking medications.  Who should not have this procedure? If you are on a blood thinning medication (e.g. Coumadin, Plavix, see list of "Blood Thinners"), or if you have an active infection going on, you should not have the procedure.  If you are taking any blood thinners, please inform your physician.  How should I prepare for this procedure?  Do not eat or drink anything at least six hours prior to the procedure.  Bring a driver with you .  It cannot be a taxi.  Come accompanied by an adult that can drive you back, and that is strong enough to help you if your legs get weak or numb from the local anesthetic.  Take all of your medicines the morning of the procedure with just enough water to swallow them.  If you have diabetes, make sure that you are scheduled to have your procedure done first thing in the morning, whenever possible.  If you have diabetes, take only half of your insulin dose and notify our nurse that you have done so as soon as you arrive at the clinic.  If you are diabetic, but only take blood sugar pills (oral hypoglycemic), then do not take them on the morning of your  procedure.  You may take them after you have had the procedure.  Do not take aspirin or any aspirin-containing medications, at least eleven (11) days prior to the procedure.  They may prolong bleeding.  Wear loose fitting clothing that may be easy to take off and that you would not mind if it got stained with Betadine or blood.  Do not wear any jewelry or perfume  Remove any nail coloring.  It will interfere with some of our monitoring equipment.  NOTE: Remember that this is not meant to be interpreted as a complete list of all possible complications.  Unforeseen problems may occur.  BLOOD THINNERS The following drugs contain aspirin or other products, which can cause increased bleeding during surgery and should not be taken for 2 weeks prior to and 1 week  after surgery.  If you should need take something for relief of minor pain, you may take acetaminophen which is found in Tylenol,m Datril, Anacin-3 and Panadol. It is not blood thinner. The products listed below are.  Do not take any of the products listed below in addition to any listed on your instruction sheet.  A.P.C or A.P.C with Codeine Codeine Phosphate Capsules #3 Ibuprofen Ridaura  ABC compound Congesprin Imuran rimadil  Advil Cope Indocin Robaxisal  Alka-Seltzer Effervescent Pain Reliever and Antacid Coricidin or Coricidin-D  Indomethacin Rufen  Alka-Seltzer plus Cold Medicine Cosprin Ketoprofen S-A-C Tablets  Anacin Analgesic Tablets or Capsules Coumadin Korlgesic Salflex  Anacin Extra Strength Analgesic tablets or capsules CP-2 Tablets Lanoril Salicylate  Anaprox Cuprimine Capsules Levenox Salocol  Anexsia-D Dalteparin Magan Salsalate  Anodynos Darvon compound Magnesium Salicylate Sine-off  Ansaid Dasin Capsules Magsal Sodium Salicylate  Anturane Depen Capsules Marnal Soma  APF Arthritis pain formula Dewitt's Pills Measurin Stanback  Argesic Dia-Gesic Meclofenamic Sulfinpyrazone  Arthritis Bayer Timed Release Aspirin  Diclofenac Meclomen Sulindac  Arthritis pain formula Anacin Dicumarol Medipren Supac  Analgesic (Safety coated) Arthralgen Diffunasal Mefanamic Suprofen  Arthritis Strength Bufferin Dihydrocodeine Mepro Compound Suprol  Arthropan liquid Dopirydamole Methcarbomol with Aspirin Synalgos  ASA tablets/Enseals Disalcid Micrainin Tagament  Ascriptin Doan's Midol Talwin  Ascriptin A/D Dolene Mobidin Tanderil  Ascriptin Extra Strength Dolobid Moblgesic Ticlid  Ascriptin with Codeine Doloprin or Doloprin with Codeine Momentum Tolectin  Asperbuf Duoprin Mono-gesic Trendar  Aspergum Duradyne Motrin or Motrin IB Triminicin  Aspirin plain, buffered or enteric coated Durasal Myochrisine Trigesic  Aspirin Suppositories Easprin Nalfon Trillsate  Aspirin with Codeine Ecotrin Regular or Extra Strength Naprosyn Uracel  Atromid-S Efficin Naproxen Ursinus  Auranofin Capsules Elmiron Neocylate Vanquish  Axotal Emagrin Norgesic Verin  Azathioprine Empirin or Empirin with Codeine Normiflo Vitamin E  Azolid Emprazil Nuprin Voltaren  Bayer Aspirin plain, buffered or children's or timed BC Tablets or powders Encaprin Orgaran Warfarin Sodium  Buff-a-Comp Enoxaparin Orudis Zorpin  Buff-a-Comp with Codeine Equegesic Os-Cal-Gesic   Buffaprin Excedrin plain, buffered or Extra Strength Oxalid   Bufferin Arthritis Strength Feldene Oxphenbutazone   Bufferin plain or Extra Strength Feldene Capsules Oxycodone with Aspirin   Bufferin with Codeine Fenoprofen Fenoprofen Pabalate or Pabalate-SF   Buffets II Flogesic Panagesic   Buffinol plain or Extra Strength Florinal or Florinal with Codeine Panwarfarin   Buf-Tabs Flurbiprofen Penicillamine   Butalbital Compound Four-way cold tablets Penicillin   Butazolidin Fragmin Pepto-Bismol   Carbenicillin Geminisyn Percodan   Carna Arthritis Reliever Geopen Persantine   Carprofen Gold's salt Persistin   Chloramphenicol Goody's Phenylbutazone   Chloromycetin Haltrain Piroxlcam    Clmetidine heparin Plaquenil   Cllnoril Hyco-pap Ponstel   Clofibrate Hydroxy chloroquine Propoxyphen         Before stopping any of these medications, be sure to consult the physician who ordered them.  Some, such as Coumadin (Warfarin) are ordered to prevent or treat serious conditions such as "deep thrombosis", "pumonary embolisms", and other heart problems.  The amount of time that you may need off of the medication may also vary with the medication and the reason for which you were taking it.  If you are taking any of these medications, please make sure you notify your pain physician before you undergo any procedures.

## 2015-07-26 NOTE — Progress Notes (Signed)
Subjective:    Patient ID: Michelle Conway, female    DOB: [birth year], 55 y.o.   MRN: [id number]  HPI  PROCEDURE PERFORMED: Lumbosacral selective nerve root block   NOTE: The patient is a 55 y.o. female who returns to [facility] for further evaluation and treatment of pain involving the lumbar and lower extremity region. 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 The patient has had more significant relief following lumbosacral selective nerve root block compared to lumbar sympathetic blocks. The risks, benefits, and expectations of the procedure have been explained to the patient who was understanding and in agreement with suggested treatment plan. We will proceed with interventional treatment as discussed and as explained to the patient. The patient is understanding and in agreement with suggested treatment plan.   DESCRIPTION OF PROCEDURE: Lumbosacral selective nerve root block with IV Versed, IV fentanyl conscious sedation, EKG, blood pressure, pulse, capnography, and pulse oximetry monitoring. The procedure was performed with the patient in the prone position under fluoroscopic guidance. With the patient in the prone position, Betadine prep of proposed entry site was performed. Local anesthetic skin wheal of proposed needle entry site was prepared with 1.5% plain lidocaine with AP view of the lumbosacral spine.   PROCEDURE #1: Needle placement at the left L 2 vertebral body: A 22 -gauge needle was inserted at the inferior  border of the transverse process of the vertebral body with needle placed medial to the midline of the transverse process on AP view of the lumbosacral spine.   NEEDLE PLACEMENT AT  L3, L4, and L5  VERTEBRAL BODY LEVELS  Needle  placement was accomplished at L3, L4, and L5  vertebral body levels on the left side exactly as was accomplished at the L2  vertebral body level  and utilizing the same technique and under fluoroscopic guidance.  PROCEDURE #4: Needle placement at the S1 foramen. With the patient in the prone position with Betadine prep of proposed entry site accomplished, the S1 foramen was visualized under fluoroscopic guidance with AP view of the lumbosacral spine with cephalad orientation of the fluoroscope with local anesthetic skin wheal of 1.5% lidocaine of proposed needle entry site prepared. A 22-gauge needle was inserted S1 foramen under fluoroscopic guidance eliciting paresthesias radiating from the buttocks to the lower extremity after which needle was slightly withdrawn.   Needle placement was then verified on lateral view at all levels with needle tip documented to be in the posterior superior quadrant of the intervertebral foramen of  L 2, L3, L4, and L5. Following negative aspiration for heme and CSF at each level, each level was injected with 3 mL of 0.25% bupivacaine with Kenalog.   LUMBOSACRAL SELECTIVE NERVE ROOT BLOCKS THE THE  RIGHT SIDE  The procedure was performed on the right side exactly as was performed on the left side and at the same levels  Under fluoroscopic guidance and utilizing the same technique.    The patient tolerated the procedure well. A total of 10 mg of Kenalog was utilized for the procedure.   PLAN:  1. Medications: Will continue presently prescribed medications Neurontin,  Norflex, and hydrocodone acetaminophen. 2. The patient is to undergo follow-up evaluation with PCP Dr. Lavera Guise for evaluation of blood pressure and general medical condition  status post procedure performed on today's visit. 3. Surgical follow-up evaluation. Has been addressed 4. Neurological evaluation. May consider PNCV EMG studies and other studies 5. May consider radiofrequency procedures, implantation type procedures and other treatment pending response to treatment and follow-up evaluation. 6. The patient has been advise do adhere to proper body mechanics and avoid activities which may aggravate condition. 7. The patient has been advised to call the [facility] prior to scheduled return appointment should there be significant change in the patient's condition or should the patient have other concerns regarding condition prior to scheduled return appointment.   Review of Systems     Objective:   Physical Exam        Assessment & Plan:

## 2015-07-27 ENCOUNTER — Telehealth: Payer: Self-pay | Admitting: *Deleted

## 2015-07-27 ENCOUNTER — Ambulatory Visit: Payer: Managed Care, Other (non HMO) | Admitting: Pain Medicine

## 2015-07-27 NOTE — Telephone Encounter (Signed)
No problems post procedure. 

## 2015-07-31 ENCOUNTER — Other Ambulatory Visit: Payer: Self-pay | Admitting: Pain Medicine

## 2015-08-02 ENCOUNTER — Ambulatory Visit: Payer: Managed Care, Other (non HMO) | Attending: Pain Medicine | Admitting: Pain Medicine

## 2015-08-02 ENCOUNTER — Encounter: Payer: Self-pay | Admitting: Pain Medicine

## 2015-08-02 VITALS — BP 127/83 | HR 75 | Temp 98.4°F | Resp 16 | Ht 63.0 in | Wt 163.0 lb

## 2015-08-02 DIAGNOSIS — M47816 Spondylosis without myelopathy or radiculopathy, lumbar region: Secondary | ICD-10-CM | POA: Insufficient documentation

## 2015-08-02 DIAGNOSIS — M5416 Radiculopathy, lumbar region: Secondary | ICD-10-CM

## 2015-08-02 DIAGNOSIS — Z96653 Presence of artificial knee joint, bilateral: Secondary | ICD-10-CM | POA: Insufficient documentation

## 2015-08-02 DIAGNOSIS — M7060 Trochanteric bursitis, unspecified hip: Secondary | ICD-10-CM | POA: Insufficient documentation

## 2015-08-02 DIAGNOSIS — M545 Low back pain, unspecified: Secondary | ICD-10-CM

## 2015-08-02 DIAGNOSIS — M5136 Other intervertebral disc degeneration, lumbar region: Secondary | ICD-10-CM | POA: Insufficient documentation

## 2015-08-02 DIAGNOSIS — G9050 Complex regional pain syndrome I, unspecified: Secondary | ICD-10-CM

## 2015-08-02 DIAGNOSIS — G90523 Complex regional pain syndrome I of lower limb, bilateral: Secondary | ICD-10-CM | POA: Insufficient documentation

## 2015-08-02 DIAGNOSIS — M79606 Pain in leg, unspecified: Secondary | ICD-10-CM | POA: Diagnosis present

## 2015-08-02 MED ORDER — HYDROCODONE-ACETAMINOPHEN 5-325 MG PO TABS: ORAL_TABLET | ORAL | Status: DC

## 2015-08-02 NOTE — Patient Instructions (Addendum)
PLAN   Continue present medication Neurontin Norflex and hydrocodone acetaminophen Please apply Voltaren gel as discussed  Lumbosacral selective nerve root block to be performed at time of return appointment  F/U PCP Dr. Rebecka Apley for evaliation of  BP and general medical  condition  F/U surgical evaluation as discussed  F/U neurological evaluation. May consider PNCV/EMG studies and other studies pending follow-up evaluations  Ask the nurses and secretary the date of your orthopedic evaluation of your total knee replacements  May consider radiofrequency rhizolysis or intraspinal procedures pending response to present treatment and F/U evaluation   Patient to call Pain Management Center should patient have concerns prior to scheduled return appointmentSelective Nerve Root Block Patient Information  Description: Specific nerve roots exit the spinal canal and these nerves can be compressed and inflamed by a bulging disc and bone spurs.  By injecting steroids on the nerve root, we can potentially decrease the inflammation surrounding these nerves, which often leads to decreased pain.  Also, by injecting local anesthesia on the nerve root, this can provide [place] helpful information to give to your referring doctor if it decreases your pain.  Selective nerve root blocks can be done along the spine from the neck to the low back depending on the location of your pain.   After numbing the skin with local anesthesia, a small needle is passed to the nerve root and the position of the needle is verified using x-ray pictures.  After the needle is in correct position, we then deposit the medication.  You may experience a pressure sensation while this is being done.  The entire block usually lasts less than 15 minutes.  Conditions that may be treated with selective nerve root blocks:  Low back and leg pain  Spinal stenosis  Diagnostic block prior to potential surgery  Neck and arm pain  Post laminectomy  syndrome  Preparation for the injection:  1. Do not eat any solid food or dairy products within 8 hours of your appointment. 2. You may drink clear liquids up to 3 hours before an appointment.  Clear liquids include water, black coffee, juice or soda.  No milk or cream please. 3. You may take your regular medications, including pain medications, with a sip of water before your appointment.  Diabetics should hold regular insulin (if taken separately) and take 1/2 normal NPH dose the morning of the procedure.  Carry some sugar containing items with you to your appointment. 4. A driver must accompany you and be prepared to drive you home after your procedure. 5. Bring all your current medications with you. 6. An IV may be inserted and sedation may be given at the discretion of the physician. 7. A blood pressure cuff, EKG, and other monitors will often be applied during the procedure.  Some patients may need to have extra oxygen administered for a short period. 8. You will be asked to provide medical information, including allergies, prior to the procedure.  We must know immediately if you are taking blood  Thinners (like Coumadin) or if you are allergic to IV iodine contrast (dye).  Possible side-effects: All are usually temporary  Bleeding from needle site  Light headedness  Numbness and tingling  Decreased blood pressure  Weakness in arms/legs  Pressure sensation in back/neck  Pain at injection site (several days)  Possible complications: All are extremely rare  Infection  Nerve injury  Spinal headache (a headache wore with upright position)  Call if you experience:  Fever/chills associated with  headache or increased back/neck pain  Headache worsened by an upright position  New onset weakness or numbness of an extremity below the injection site  Hives or difficulty breathing (go to the emergency room)  Inflammation or drainage at the injection site(s)  Severe  back/neck pain greater than usual  New symptoms which are concerning to you  Please note:  Although the local anesthetic injected can often make your back or neck feel good for several hours after the injection the pain will likely return.  It takes 3-5 days for steroids to work on the nerve root. You may not notice any pain relief for at least one week.  If effective, we will often do a series of 3 injections spaced 3-6 weeks apart to maximally decrease your pain.    If you have any questions, please call [phone] [facility] Clinic[facility] RISKS AND COMPLICATIONS  What are the risk, side effects and possible complications? Generally speaking, most procedures are safe.  However, with any procedure there are risks, side effects, and the possibility of complications.  The risks and complications are dependent upon the sites that are lesioned, or the type of nerve block to be performed.  The closer the procedure is to the spine, the more serious the risks are.  Great care is taken when placing the radio frequency needles, block needles or lesioning probes, but sometimes complications can occur. 1. Infection: Any time there is an injection through the skin, there is a risk of infection.  This is why sterile conditions are used for these blocks.  There are four possible types of infection. 1. Localized skin infection. 2. Central Nervous System Infection-This can be in the form of Meningitis, which can be deadly. 3. Epidural Infections-This can be in the form of an epidural abscess, which can cause pressure inside of the spine, causing compression of the spinal cord with subsequent paralysis. This would require an emergency surgery to decompress, and there are no guarantees that the patient would recover from the paralysis. 4. Discitis-This is an infection of the intervertebral discs.  It occurs in about 1% of discography procedures.  It is difficult to treat and it may  lead to surgery.        2. Pain: the needles have to go through skin and soft tissues, will cause soreness.       3. Damage to internal structures:  The nerves to be lesioned may be near blood vessels or    other nerves which can be potentially damaged.       4. Bleeding: Bleeding is more common if the patient is taking blood thinners such as  aspirin, Coumadin, Ticiid, Plavix, etc., or if he/she have some genetic predisposition  such as hemophilia. Bleeding into the spinal canal can cause compression of the spinal  cord with subsequent paralysis.  This would require an emergency surgery to  decompress and there are no guarantees that the patient would recover from the  paralysis.       5. Pneumothorax:  Puncturing of a lung is a possibility, every time a needle is introduced in  the area of the chest or upper back.  Pneumothorax refers to free air around the  collapsed lung(s), inside of the thoracic cavity (chest cavity).  Another two possible  complications related to a similar event would include: Hemothorax and Chylothorax.   These are variations of the Pneumothorax, where instead of air around the collapsed  lung(s), you may have blood  or chyle, respectively.       6. Spinal headaches: They may occur with any procedures in the area of the spine.       7. Persistent CSF (Cerebro-Spinal Fluid) leakage: This is a rare problem, but may occur  with prolonged intrathecal or epidural catheters either due to the formation of a fistulous  track or a dural tear.       8. Nerve damage: By working so close to the spinal cord, there is always a possibility of  nerve damage, which could be as serious as a permanent spinal cord injury with  paralysis.       9. Death:  Although rare, severe deadly allergic reactions known as "Anaphylactic  reaction" can occur to any of the medications used.      10. Worsening of the symptoms:  We can always make thing worse.  What are the chances of something like this  happening? Chances of any of this occuring are extremely low.  By statistics, you have more of a chance of getting killed in a motor vehicle accident: while driving to the hospital than any of the above occurring .  Nevertheless, you should be aware that they are possibilities.  In general, it is similar to taking a shower.  Everybody knows that you can slip, hit your head and get killed.  Does that mean that you should not shower again?  Nevertheless always keep in mind that statistics do not mean anything if you happen to be on the wrong side of them.  Even if a procedure has a 1 (one) in a 1,000,000 (million) chance of going wrong, it you happen to be that one..Also, keep in mind that by statistics, you have more of a chance of having something go wrong when taking medications.  Who should not have this procedure? If you are on a blood thinning medication (e.g. Coumadin, Plavix, see list of "Blood Thinners"), or if you have an active infection going on, you should not have the procedure.  If you are taking any blood thinners, please inform your physician.  How should I prepare for this procedure?  Do not eat or drink anything at least six hours prior to the procedure.  Bring a driver with you .  It cannot be a taxi.  Come accompanied by an adult that can drive you back, and that is strong enough to help you if your legs get weak or numb from the local anesthetic.  Take all of your medicines the morning of the procedure with just enough water to swallow them.  If you have diabetes, make sure that you are scheduled to have your procedure done first thing in the morning, whenever possible.  If you have diabetes, take only half of your insulin dose and notify our nurse that you have done so as soon as you arrive at the clinic.  If you are diabetic, but only take blood sugar pills (oral hypoglycemic), then do not take them on the morning of your procedure.  You may take them after you have had the  procedure.  Do not take aspirin or any aspirin-containing medications, at least eleven (11) days prior to the procedure.  They may prolong bleeding.  Wear loose fitting clothing that may be easy to take off and that you would not mind if it got stained with Betadine or blood.  Do not wear any jewelry or perfume  Remove any nail coloring.  It will interfere with some of  our monitoring equipment.  NOTE: Remember that this is not meant to be interpreted as a complete list of all possible complications.  Unforeseen problems may occur.  BLOOD THINNERS The following drugs contain aspirin or other products, which can cause increased bleeding during surgery and should not be taken for 2 weeks prior to and 1 week after surgery.  If you should need take something for relief of minor pain, you may take acetaminophen which is found in Tylenol,m Datril, Anacin-3 and Panadol. It is not blood thinner. The products listed below are.  Do not take any of the products listed below in addition to any listed on your instruction sheet.  A.P.C or A.P.C with Codeine Codeine Phosphate Capsules #3 Ibuprofen Ridaura  ABC compound Congesprin Imuran rimadil  Advil Cope Indocin Robaxisal  Alka-Seltzer Effervescent Pain Reliever and Antacid Coricidin or Coricidin-D  Indomethacin Rufen  Alka-Seltzer plus Cold Medicine Cosprin Ketoprofen S-A-C Tablets  Anacin Analgesic Tablets or Capsules Coumadin Korlgesic Salflex  Anacin Extra Strength Analgesic tablets or capsules CP-2 Tablets Lanoril Salicylate  Anaprox Cuprimine Capsules Levenox Salocol  Anexsia-D Dalteparin Magan Salsalate  Anodynos Darvon compound Magnesium Salicylate Sine-off  Ansaid Dasin Capsules Magsal Sodium Salicylate  Anturane Depen Capsules Marnal Soma  APF Arthritis pain formula Dewitt's Pills Measurin Stanback  Argesic Dia-Gesic Meclofenamic Sulfinpyrazone  Arthritis Bayer Timed Release Aspirin Diclofenac Meclomen Sulindac  Arthritis pain formula  Anacin Dicumarol Medipren Supac  Analgesic (Safety coated) Arthralgen Diffunasal Mefanamic Suprofen  Arthritis Strength Bufferin Dihydrocodeine Mepro Compound Suprol  Arthropan liquid Dopirydamole Methcarbomol with Aspirin Synalgos  ASA tablets/Enseals Disalcid Micrainin Tagament  Ascriptin Doan's Midol Talwin  Ascriptin A/D Dolene Mobidin Tanderil  Ascriptin Extra Strength Dolobid Moblgesic Ticlid  Ascriptin with Codeine Doloprin or Doloprin with Codeine Momentum Tolectin  Asperbuf Duoprin Mono-gesic Trendar  Aspergum Duradyne Motrin or Motrin IB Triminicin  Aspirin plain, buffered or enteric coated Durasal Myochrisine Trigesic  Aspirin Suppositories Easprin Nalfon Trillsate  Aspirin with Codeine Ecotrin Regular or Extra Strength Naprosyn Uracel  Atromid-S Efficin Naproxen Ursinus  Auranofin Capsules Elmiron Neocylate Vanquish  Axotal Emagrin Norgesic Verin  Azathioprine Empirin or Empirin with Codeine Normiflo Vitamin E  Azolid Emprazil Nuprin Voltaren  Bayer Aspirin plain, buffered or children's or timed BC Tablets or powders Encaprin Orgaran Warfarin Sodium  Buff-a-Comp Enoxaparin Orudis Zorpin  Buff-a-Comp with Codeine Equegesic Os-Cal-Gesic   Buffaprin Excedrin plain, buffered or Extra Strength Oxalid   Bufferin Arthritis Strength Feldene Oxphenbutazone   Bufferin plain or Extra Strength Feldene Capsules Oxycodone with Aspirin   Bufferin with Codeine Fenoprofen Fenoprofen Pabalate or Pabalate-SF   Buffets II Flogesic Panagesic   Buffinol plain or Extra Strength Florinal or Florinal with Codeine Panwarfarin   Buf-Tabs Flurbiprofen Penicillamine   Butalbital Compound Four-way cold tablets Penicillin   Butazolidin Fragmin Pepto-Bismol   Carbenicillin Geminisyn Percodan   Carna Arthritis Reliever Geopen Persantine   Carprofen Gold's salt Persistin   Chloramphenicol Goody's Phenylbutazone   Chloromycetin Haltrain Piroxlcam   Clmetidine heparin Plaquenil   Cllnoril Hyco-pap  Ponstel   Clofibrate Hydroxy chloroquine Propoxyphen         Before stopping any of these medications, be sure to consult the physician who ordered them.  Some, such as Coumadin (Warfarin) are ordered to prevent or treat serious conditions such as "deep thrombosis", "pumonary embolisms", and other heart problems.  The amount of time that you may need off of the medication may also vary with the medication and the reason for which you were taking it.  If you  are taking any of these medications, please make sure you notify your pain physician before you undergo any procedures.

## 2015-08-02 NOTE — Progress Notes (Signed)
Subjective:    Patient ID: Michelle Conway, female    DOB: 1961-01-17, 55 y.o.   MRN: [id number]  HPI  The patient is a 55 year old female who returns to pain management for further evaluation and treatment of pain involving the lower back lower extremity regions with pain involving the left and right knees. The patient is status post total knee replacement of the left and right lower extremities and is has significant improvement of her pain following lumbosacral selective nerve root blocks compared to lumbar sympathetic blocks. The patient continues medications consisting of Neurontin Norflex and hydrocodone acetaminophen. The patient states that the burning stinging sensations has decreased significantly following the procedure lumbosacral selective nerve root block. We'll consider patient for additional modifications of treatment regimen pending response to treatment and follow-up evaluation. We'll also discussed patient undergoing reevaluation in terms of surgical evaluation. Patient prefers to avoid surgical reevaluation at this time. We will continue Neurontin Norflex and hydrocodone acetaminophen and will proceed with lumbosacral selective nerve root block to be performed at time return appointment. Patient states that the procedure allows her to work in events her from extension to severely disabling pain interferes with all activities of daily living and keeps her awake at night as well   Review of Systems     Objective:   Physical Exam  There was tenderness to palpation of the splenius capitis and occipitalis musculature region a mild degree with mild tenderness of the cervical facet cervical paraspinal musculature region. Palpation of the acromioclavicular and glenohumeral joint regions reproduces minimal discomfort and patient appeared to be with unremarkable Spurling's maneuver. The patient was with bilaterally equal grip strength with Tinel and Phalen's maneuver reproducing minimal  discomfort. Palpation over the thoracic region thoracic facet region was attends to palpation of moderate degree in the lower thoracic paraspinal musculature region with no crepitus of the thoracic region noted. Palpation over the lumbar paraspinal musculatures and lumbar facet region was attends to palpation of moderate degree with lateral bending rotation extension and palpation of the lumbar facets reproducing moderate discomfort. There was moderate tenderness of the PSIS and PII S region as well as the gluteal and piriformis musculature regions. Straight leg raising was tolerates approximately 20 without increased pain with dorsiflexion noted. There were well-healed surgical scars of the knees without increased warmth and erythema in the region of the knees with mild to moderate tenderness to palpation of the knees. EHL strength appeared to be decreased and there was no sensory deficit or dermatomal distribution detected. There was greater trochanteric region iliotibial band region a moderate degree. There was negative clonus negative Homans. Abdomen was nontender and no costovertebral tenderness was noted.      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 Rebecka Apley for evaliation of  BP and general medical  condition  F/U surgical evaluation as discussed  F/U neurological evaluation. May consider PNCV/EMG studies and other studies pending follow-up evaluations  Ask the nurses and  secretary the date of your orthopedic evaluation of your total  knee replacements  May consider radiofrequency rhizolysis or intraspinal procedures pending response to present treatment and F/U evaluation   Patient to call Pain Management Center should patient have concerns prior to scheduled return appointment

## 2015-08-02 NOTE — Progress Notes (Signed)
Patient here for medication refill. Safety precautions to be maintained throughout the outpatient stay will include: orient to surroundings, keep bed in low position, maintain call bell within reach at all times, provide assistance with transfer out of bed and ambulation.  

## 2015-09-02 ENCOUNTER — Ambulatory Visit: Payer: Managed Care, Other (non HMO) | Attending: Pain Medicine | Admitting: Pain Medicine

## 2015-09-02 ENCOUNTER — Encounter: Payer: Self-pay | Admitting: Pain Medicine

## 2015-09-02 VITALS — BP 107/79 | HR 80 | Temp 97.8°F | Resp 18 | Ht 63.0 in | Wt 165.0 lb

## 2015-09-02 DIAGNOSIS — M545 Low back pain, unspecified: Secondary | ICD-10-CM

## 2015-09-02 DIAGNOSIS — M79604 Pain in right leg: Secondary | ICD-10-CM | POA: Diagnosis present

## 2015-09-02 DIAGNOSIS — M47816 Spondylosis without myelopathy or radiculopathy, lumbar region: Secondary | ICD-10-CM | POA: Insufficient documentation

## 2015-09-02 DIAGNOSIS — G90523 Complex regional pain syndrome I of lower limb, bilateral: Secondary | ICD-10-CM | POA: Insufficient documentation

## 2015-09-02 DIAGNOSIS — M7060 Trochanteric bursitis, unspecified hip: Secondary | ICD-10-CM | POA: Diagnosis not present

## 2015-09-02 DIAGNOSIS — Z96653 Presence of artificial knee joint, bilateral: Secondary | ICD-10-CM | POA: Diagnosis not present

## 2015-09-02 DIAGNOSIS — M79605 Pain in left leg: Secondary | ICD-10-CM | POA: Diagnosis present

## 2015-09-02 DIAGNOSIS — M5416 Radiculopathy, lumbar region: Secondary | ICD-10-CM

## 2015-09-02 DIAGNOSIS — M5136 Other intervertebral disc degeneration, lumbar region: Secondary | ICD-10-CM | POA: Insufficient documentation

## 2015-09-02 DIAGNOSIS — G9050 Complex regional pain syndrome I, unspecified: Secondary | ICD-10-CM

## 2015-09-02 MED ORDER — GABAPENTIN 300 MG PO CAPS: ORAL_CAPSULE | ORAL | Status: DC

## 2015-09-02 MED ORDER — ORPHENADRINE CITRATE ER 100 MG PO TB12: ORAL_TABLET | ORAL | Status: DC

## 2015-09-02 MED ORDER — HYDROCODONE-ACETAMINOPHEN 5-325 MG PO TABS: ORAL_TABLET | ORAL | Status: DC

## 2015-09-02 NOTE — Patient Instructions (Addendum)
PLAN   Continue present medication Neurontin Norflex and hydrocodone acetaminophen Please apply Voltaren gel as discussed  F/U PCP Dr. Rebecka Apley for evaliation of  BP and general medical  Condition as discussed  F/U surgical evaluation as discussed  F/U neurological evaluation. May consider PNCV/EMG studies and other studies pending follow-up evaluations  Ask the nurses and secretary the date of your orthopedic evaluation of your total knee replacements as discussed  May consider radiofrequency rhizolysis or intraspinal procedures pending response to present treatment and F/U evaluation   Patient to call Pain Management Center should patient have concerns prior to scheduled return appointment

## 2015-09-02 NOTE — Progress Notes (Signed)
Subjective:    Patient ID: Michelle Conway, female    DOB: 02/25/1961, 56 y.o.   MRN: [id number]  HPI  The patient is a 55 year old female who returns to pain management for further evaluation and treatment of pain involving the lower back and lower extremity regions especially the region of the knees the patient is status post total knee replacements and states that she has had return of pain of significant degree. We have discussed patient undergoing orthopedic reevaluation of the knees. At the present time patient will continue presently prescribed medications  Consisting of Neurontin Norflex and hydrocodone acetaminophen. We will also discussed patient applying Voltaren gel to the knees. The patient has obtained most significant relief following lumbosacral selective nerve root blocks when compared to lumbar sympathetic blocks. The patient has been felt to be with component of complex regional pain syndrome of the lower extremities as well as pain due to hardware in the region of the joint. We will proceed with lumbosacral selective nerve root block as discussed with patient and will consider additional modifications of treatment pending follow-up evaluation. The patient will return for medication refill before her next appointment and then we will consider additional interventional treatment following her next appointment. All agreed to suggested treatment plan      Review of Systems     Objective:   Physical Exam   There was tenderness to palpation of the splenius capitis and occipitalis musculature regions of mild degree with mild tenderness over the thoracic facet thoracic paraspinal musculature region with no crepitus of the thoracic region noted. The patient appeared to be with bilaterally equal grip strength and Tinel and Phalen's maneuver were without increased pain of significant degree. Palpation of the acromioclavicular and glenohumeral joint regions reproduce mild discomfort and  patient appeared to be with unremarkable Spurling's maneuver. Palpation over the lumbar paraspinal musculatures and lumbar facet region was associated with moderately severe discomfort with lateral bending rotation extension and palpation of the lumbar facets reproducing moderately severe discomfort. There was moderate tenderness over the PSIS and PII S region as well as the gluteal and piriformis musculature regions. There was moderate tenderness of the greater trochanteric region iliotibial band region. Straight leg raise was tolerates approximately 30. There were well-healed scars of the knees with moderately severe tenderness to palpation of the knees without definite increased warmth and erythema in the region of the knees. EHL strength appeared to be decreased. No definite sensory deficit of dermatomal dystrophy detected. There was negative clonus negative Homans. 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 Rebecka Apley for evaliation of  BP and general medical  Condition as discussed. We will consider lumbosacral selective nerve root blocks to be performed in July 2017 as discussed  F/U surgical evaluation as discussed  F/U neurological evaluation. May consider PNCV/EMG studies and other studies pending follow-up evaluations  Ask the nurses and secretary the date of your orthopedic evaluation of your total knee replacements as discussed  May consider radiofrequency rhizolysis or intraspinal procedures  pending response to  present treatment and F/U evaluation   Patient to call Pain Management Center should patient have concerns prior to scheduled return appointment

## 2015-09-02 NOTE — Progress Notes (Signed)
Safety precautions to be maintained throughout the outpatient stay will include: orient to surroundings, keep bed in low position, maintain call bell within reach at all times, provide assistance with transfer out of bed and ambulation.  

## 2015-09-09 LAB — TOXASSURE SELECT 13 (MW), URINE: PDF: 0

## 2015-09-09 NOTE — Progress Notes (Signed)
Quick Note:  Reviewed. ______ 

## 2015-09-22 ENCOUNTER — Ambulatory Visit: Payer: Managed Care, Other (non HMO) | Admitting: Pain Medicine

## 2015-09-30 ENCOUNTER — Encounter: Payer: Self-pay | Admitting: Pain Medicine

## 2015-09-30 ENCOUNTER — Ambulatory Visit: Payer: Managed Care, Other (non HMO) | Attending: Pain Medicine | Admitting: Pain Medicine

## 2015-09-30 VITALS — BP 116/72 | HR 74 | Temp 98.0°F | Resp 15 | Ht 63.0 in | Wt 165.0 lb

## 2015-09-30 DIAGNOSIS — M47816 Spondylosis without myelopathy or radiculopathy, lumbar region: Secondary | ICD-10-CM | POA: Insufficient documentation

## 2015-09-30 DIAGNOSIS — G90523 Complex regional pain syndrome I of lower limb, bilateral: Secondary | ICD-10-CM | POA: Insufficient documentation

## 2015-09-30 DIAGNOSIS — M545 Low back pain, unspecified: Secondary | ICD-10-CM

## 2015-09-30 DIAGNOSIS — M5136 Other intervertebral disc degeneration, lumbar region: Secondary | ICD-10-CM | POA: Insufficient documentation

## 2015-09-30 DIAGNOSIS — M5416 Radiculopathy, lumbar region: Secondary | ICD-10-CM

## 2015-09-30 DIAGNOSIS — G9050 Complex regional pain syndrome I, unspecified: Secondary | ICD-10-CM

## 2015-09-30 DIAGNOSIS — M25561 Pain in right knee: Secondary | ICD-10-CM | POA: Diagnosis present

## 2015-09-30 DIAGNOSIS — M25562 Pain in left knee: Secondary | ICD-10-CM | POA: Diagnosis present

## 2015-09-30 DIAGNOSIS — Z96653 Presence of artificial knee joint, bilateral: Secondary | ICD-10-CM | POA: Insufficient documentation

## 2015-09-30 DIAGNOSIS — M7060 Trochanteric bursitis, unspecified hip: Secondary | ICD-10-CM | POA: Insufficient documentation

## 2015-09-30 MED ORDER — HYDROCODONE-ACETAMINOPHEN 5-325 MG PO TABS: ORAL_TABLET | ORAL | Status: DC

## 2015-09-30 NOTE — Progress Notes (Signed)
Subjective:    Patient ID: Michelle Conway, female    DOB: 04/02/1961, 55 y.o.   MRN: [id number]  HPI  The patient is a 55 year old female who returns to pain management for further evaluation and treatment of pain involving the lower back lower extremity region especially the region of the knees. The patient states that the left knee is more bothersome than right knee. The patient is undergone surgical intervention by Dr.Dellaero area the patient states that she continues to have significant pain involving both knees and that she is improved significantly with interventional treatment performed in pain management. We discussed patient's condition and patient wishes to undergo lumbosacral selective nerve root block to be performed at time return appointment. The patient has had significant improvement of her pain with lumbosacral selective nerve root blocks. We will also discussed patient undergoing genicular nerve blocks of the knee in attempt to decrease the pain of the knee more significantly and patient may be candidate for radiofrequency rhizolysis of the genicular nerves. We will continue medications consisting of Norflex Neurontin and hydrocodone acetaminophen and we will schedule patient for lumbosacral selective nerve root block to be performed at time return appointment. The patient is also considering genicular nerve blocks. All agreed to suggested treatment plan   Review of Systems     Objective:   Physical Exam  There was tenderness of the splenius capitis and occipitalis region palpation which reproduces minimal discomfort with minimal tenderness of the cervical facet cervical paraspinal musculature region and minimal tenderness of the thoracic facet thoracic paraspinal musculature region. The patient was with bilaterally equal grip strength and Tinel and Phalen's maneuver were without increased pain of significant degree. Palpation of the acromioclavicular and glenohumeral joint  regions reproduced minimal discomfort. Palpation over the thoracic region was without tenderness to palpation of any significant degree. Palpation over the lumbar region lumbar paraspinal musculature region reproduced moderate discomfort. There was tenderness over the region of the knees with well-healed surgical scars of the knees with significant increased pain with range of motion maneuvers of the knees. No definite increased warmth or erythema of the knees noted and no new lesions of the skin in the region of the knees were noted EHL strength appeared to be slightly decreased. 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 We will further discuss genicular nerve blocks of the knee on follow-up appointments  F/U PCP Dr. Rebecka Apley for evaliation of  BP and general medical condition   F/U surgical evaluation May consider further orthopedic evaluation of knees especially as discussed  F/U neurological evaluation. May consider PNCV/EMG studies and other studies pending follow-up evaluations  Ask the nurses and secretary the date of your orthopedic evaluation of your total knee replacements as discussed  May consider radiofrequency rhizolysis or intraspinal procedures  pending response to present treatment and  F/U evaluation   Patient to call Pain Management Center should patient have concerns prior to scheduled return appointment

## 2015-09-30 NOTE — Progress Notes (Signed)
Safety precautions to be maintained throughout the outpatient stay will include: orient to surroundings, keep bed in low position, maintain call bell within reach at all times, provide assistance with transfer out of bed and ambulation.  

## 2015-09-30 NOTE — Patient Instructions (Addendum)
PLAN   Continue present medication Neurontin Norflex and hydrocodone acetaminophen Please apply Voltaren gel as discussed  Lumbosacral selective nerve root block to be performed at time return appointment May consider genicular nerve blocks of the knee as discussed. We will further discuss genicular nerve blocks of the knee on follow-up appointments  F/U PCP Dr. Rebecka Apley for evaliation of  BP and general medical condition   F/U surgical evaluation May consider further orthopedic evaluation of knees especially as discussed  F/U neurological evaluation. May consider PNCV/EMG studies and other studies pending follow-up evaluations  Ask the nurses and secretary the date of your orthopedic evaluation of your total knee replacements as discussed  May consider radiofrequency rhizolysis or intraspinal procedures pending response to present treatment and F/U evaluation   Patient to call Pain Management Center should patient have concerns prior to scheduled return appointmentSelective Nerve Root Block Patient Information  Description: Specific nerve roots exit the spinal canal and these nerves can be compressed and inflamed by a bulging disc and bone spurs.  By injecting steroids on the nerve root, we can potentially decrease the inflammation surrounding these nerves, which often leads to decreased pain.  Also, by injecting local anesthesia on the nerve root, this can provide [place] helpful information to give to your referring doctor if it decreases your pain.  Selective nerve root blocks can be done along the spine from the neck to the low back depending on the location of your pain.   After numbing the skin with local anesthesia, a small needle is passed to the nerve root and the position of the needle is verified using x-ray pictures.  After the needle is in correct position, we then deposit the medication.  You may experience a pressure sensation while this is being done.  The entire block usually lasts  less than 15 minutes.  Conditions that may be treated with selective nerve root blocks:  Low back and leg pain  Spinal stenosis  Diagnostic block prior to potential surgery  Neck and arm pain  Post laminectomy syndrome  Preparation for the injection:  1. Do not eat any solid food or dairy products within 8 hours of your appointment. 2. You may drink clear liquids up to 3 hours before an appointment.  Clear liquids include water, black coffee, juice or soda.  No milk or cream please. 3. You may take your regular medications, including pain medications, with a sip of water before your appointment.  Diabetics should hold regular insulin (if taken separately) and take 1/2 normal NPH dose the morning of the procedure.  Carry some sugar containing items with you to your appointment. 4. A driver must accompany you and be prepared to drive you home after your procedure. 5. Bring all your current medications with you. 6. An IV may be inserted and sedation may be given at the discretion of the physician. 7. A blood pressure cuff, EKG, and other monitors will often be applied during the procedure.  Some patients may need to have extra oxygen administered for a short period. 8. You will be asked to provide medical information, including allergies, prior to the procedure.  We must know immediately if you are taking blood  Thinners (like Coumadin) or if you are allergic to IV iodine contrast (dye).  Possible side-effects: All are usually temporary  Bleeding from needle site  Light headedness  Numbness and tingling  Decreased blood pressure  Weakness in arms/legs  Pressure sensation in back/neck  Pain at injection site (  several days)  Possible complications: All are extremely rare  Infection  Nerve injury  Spinal headache (a headache wore with upright position)  Call if you experience:  Fever/chills associated with headache or increased back/neck pain  Headache worsened by  an upright position  New onset weakness or numbness of an extremity below the injection site  Hives or difficulty breathing (go to the emergency room)  Inflammation or drainage at the injection site(s)  Severe back/neck pain greater than usual  New symptoms which are concerning to you  Please note:  Although the local anesthetic injected can often make your back or neck feel good for several hours after the injection the pain will likely return.  It takes 3-5 days for steroids to work on the nerve root. You may not notice any pain relief for at least one week.  If effective, we will often do a series of 3 injections spaced 3-6 weeks apart to maximally decrease your pain.    If you have any questions, please call [phone] [facility] ClinicGENERAL RISKS AND COMPLICATIONS  What are the risk, side effects and possible complications? Generally speaking, most procedures are safe.  However, with any procedure there are risks, side effects, and the possibility of complications.  The risks and complications are dependent upon the sites that are lesioned, or the type of nerve block to be performed.  The closer the procedure is to the spine, the more serious the risks are.  Great care is taken when placing the radio frequency needles, block needles or lesioning probes, but sometimes complications can occur. 1. Infection: Any time there is an injection through the skin, there is a risk of infection.  This is why sterile conditions are used for these blocks.  There are four possible types of infection. 1. Localized skin infection. 2. Central Nervous System Infection-This can be in the form of Meningitis, which can be deadly. 3. Epidural Infections-This can be in the form of an epidural abscess, which can cause pressure inside of the spine, causing compression of the spinal cord with subsequent paralysis. This would require an emergency surgery to decompress, and there are  no guarantees that the patient would recover from the paralysis. 4. Discitis-This is an infection of the intervertebral discs.  It occurs in about 1% of discography procedures.  It is difficult to treat and it may lead to surgery.        2. Pain: the needles have to go through skin and soft tissues, will cause soreness.       3. Damage to internal structures:  The nerves to be lesioned may be near blood vessels or    other nerves which can be potentially damaged.       4. Bleeding: Bleeding is more common if the patient is taking blood thinners such as  aspirin, Coumadin, Ticiid, Plavix, etc., or if he/she have some genetic predisposition  such as hemophilia. Bleeding into the spinal canal can cause compression of the spinal  cord with subsequent paralysis.  This would require an emergency surgery to  decompress and there are no guarantees that the patient would recover from the  paralysis.       5. Pneumothorax:  Puncturing of a lung is a possibility, every time a needle is introduced in  the area of the chest or upper back.  Pneumothorax refers to free air around the  collapsed lung(s), inside of the thoracic cavity (chest cavity).  Another two possible  complications related to a similar event would include: Hemothorax and Chylothorax.   These are variations of the Pneumothorax, where instead of air around the collapsed  lung(s), you may have blood or chyle, respectively.       6. Spinal headaches: They may occur with any procedures in the area of the spine.       7. Persistent CSF (Cerebro-Spinal Fluid) leakage: This is a rare problem, but may occur  with prolonged intrathecal or epidural catheters either due to the formation of a fistulous  track or a dural tear.       8. Nerve damage: By working so close to the spinal cord, there is always a possibility of  nerve damage, which could be as serious as a permanent spinal cord injury with  paralysis.       9. Death:  Although rare, severe deadly  allergic reactions known as "Anaphylactic  reaction" can occur to any of the medications used.      10. Worsening of the symptoms:  We can always make thing worse.  What are the chances of something like this happening? Chances of any of this occuring are extremely low.  By statistics, you have more of a chance of getting killed in a motor vehicle accident: while driving to the hospital than any of the above occurring .  Nevertheless, you should be aware that they are possibilities.  In general, it is similar to taking a shower.  Everybody knows that you can slip, hit your head and get killed.  Does that mean that you should not shower again?  Nevertheless always keep in mind that statistics do not mean anything if you happen to be on the wrong side of them.  Even if a procedure has a 1 (one) in a 1,000,000 (million) chance of going wrong, it you happen to be that one..Also, keep in mind that by statistics, you have more of a chance of having something go wrong when taking medications.  Who should not have this procedure? If you are on a blood thinning medication (e.g. Coumadin, Plavix, see list of "Blood Thinners"), or if you have an active infection going on, you should not have the procedure.  If you are taking any blood thinners, please inform your physician.  How should I prepare for this procedure?  Do not eat or drink anything at least six hours prior to the procedure.  Bring a driver with you .  It cannot be a taxi.  Come accompanied by an adult that can drive you back, and that is strong enough to help you if your legs get weak or numb from the local anesthetic.  Take all of your medicines the morning of the procedure with just enough water to swallow them.  If you have diabetes, make sure that you are scheduled to have your procedure done first thing in the morning, whenever possible.  If you have diabetes, take only half of your insulin dose and notify our nurse that you have done so  as soon as you arrive at the clinic.  If you are diabetic, but only take blood sugar pills (oral hypoglycemic), then do not take them on the morning of your procedure.  You may take them after you have had the procedure.  Do not take aspirin or any aspirin-containing medications, at least eleven (11) days prior to the procedure.  They may prolong bleeding.  Wear loose fitting clothing that may be easy to take off and that  you would not mind if it got stained with Betadine or blood.  Do not wear any jewelry or perfume  Remove any nail coloring.  It will interfere with some of our monitoring equipment.  NOTE: Remember that this is not meant to be interpreted as a complete list of all possible complications.  Unforeseen problems may occur.  BLOOD THINNERS The following drugs contain aspirin or other products, which can cause increased bleeding during surgery and should not be taken for 2 weeks prior to and 1 week after surgery.  If you should need take something for relief of minor pain, you may take acetaminophen which is found in Tylenol,m Datril, Anacin-3 and Panadol. It is not blood thinner. The products listed below are.  Do not take any of the products listed below in addition to any listed on your instruction sheet.  A.P.C or A.P.C with Codeine Codeine Phosphate Capsules #3 Ibuprofen Ridaura  ABC compound Congesprin Imuran rimadil  Advil Cope Indocin Robaxisal  Alka-Seltzer Effervescent Pain Reliever and Antacid Coricidin or Coricidin-D  Indomethacin Rufen  Alka-Seltzer plus Cold Medicine Cosprin Ketoprofen S-A-C Tablets  Anacin Analgesic Tablets or Capsules Coumadin Korlgesic Salflex  Anacin Extra Strength Analgesic tablets or capsules CP-2 Tablets Lanoril Salicylate  Anaprox Cuprimine Capsules Levenox Salocol  Anexsia-D Dalteparin Magan Salsalate  Anodynos Darvon compound Magnesium Salicylate Sine-off  Ansaid Dasin Capsules Magsal Sodium Salicylate  Anturane Depen Capsules Marnal  Soma  APF Arthritis pain formula Dewitt's Pills Measurin Stanback  Argesic Dia-Gesic Meclofenamic Sulfinpyrazone  Arthritis Bayer Timed Release Aspirin Diclofenac Meclomen Sulindac  Arthritis pain formula Anacin Dicumarol Medipren Supac  Analgesic (Safety coated) Arthralgen Diffunasal Mefanamic Suprofen  Arthritis Strength Bufferin Dihydrocodeine Mepro Compound Suprol  Arthropan liquid Dopirydamole Methcarbomol with Aspirin Synalgos  ASA tablets/Enseals Disalcid Micrainin Tagament  Ascriptin Doan's Midol Talwin  Ascriptin A/D Dolene Mobidin Tanderil  Ascriptin Extra Strength Dolobid Moblgesic Ticlid  Ascriptin with Codeine Doloprin or Doloprin with Codeine Momentum Tolectin  Asperbuf Duoprin Mono-gesic Trendar  Aspergum Duradyne Motrin or Motrin IB Triminicin  Aspirin plain, buffered or enteric coated Durasal Myochrisine Trigesic  Aspirin Suppositories Easprin Nalfon Trillsate  Aspirin with Codeine Ecotrin Regular or Extra Strength Naprosyn Uracel  Atromid-S Efficin Naproxen Ursinus  Auranofin Capsules Elmiron Neocylate Vanquish  Axotal Emagrin Norgesic Verin  Azathioprine Empirin or Empirin with Codeine Normiflo Vitamin E  Azolid Emprazil Nuprin Voltaren  Bayer Aspirin plain, buffered or children's or timed BC Tablets or powders Encaprin Orgaran Warfarin Sodium  Buff-a-Comp Enoxaparin Orudis Zorpin  Buff-a-Comp with Codeine Equegesic Os-Cal-Gesic   Buffaprin Excedrin plain, buffered or Extra Strength Oxalid   Bufferin Arthritis Strength Feldene Oxphenbutazone   Bufferin plain or Extra Strength Feldene Capsules Oxycodone with Aspirin   Bufferin with Codeine Fenoprofen Fenoprofen Pabalate or Pabalate-SF   Buffets II Flogesic Panagesic   Buffinol plain or Extra Strength Florinal or Florinal with Codeine Panwarfarin   Buf-Tabs Flurbiprofen Penicillamine   Butalbital Compound Four-way cold tablets Penicillin   Butazolidin Fragmin Pepto-Bismol   Carbenicillin Geminisyn Percodan   Carna  Arthritis Reliever Geopen Persantine   Carprofen Gold's salt Persistin   Chloramphenicol Goody's Phenylbutazone   Chloromycetin Haltrain Piroxlcam   Clmetidine heparin Plaquenil   Cllnoril Hyco-pap Ponstel   Clofibrate Hydroxy chloroquine Propoxyphen         Before stopping any of these medications, be sure to consult the physician who ordered them.  Some, such as Coumadin (Warfarin) are ordered to prevent or treat serious conditions such as "deep thrombosis", "pumonary embolisms", and other heart  problems.  The amount of time that you may need off of the medication may also vary with the medication and the reason for which you were taking it.  If you are taking any of these medications, please make sure you notify your pain physician before you undergo any procedures.

## 2015-10-27 ENCOUNTER — Encounter: Payer: Self-pay | Admitting: Pain Medicine

## 2015-10-27 ENCOUNTER — Ambulatory Visit: Payer: Managed Care, Other (non HMO) | Attending: Pain Medicine | Admitting: Pain Medicine

## 2015-10-27 VITALS — BP 104/62 | HR 58 | Temp 96.9°F | Resp 18 | Ht 63.0 in | Wt 165.0 lb

## 2015-10-27 DIAGNOSIS — G9050 Complex regional pain syndrome I, unspecified: Secondary | ICD-10-CM

## 2015-10-27 DIAGNOSIS — Z96653 Presence of artificial knee joint, bilateral: Secondary | ICD-10-CM | POA: Diagnosis not present

## 2015-10-27 DIAGNOSIS — M545 Low back pain, unspecified: Secondary | ICD-10-CM

## 2015-10-27 DIAGNOSIS — M5416 Radiculopathy, lumbar region: Secondary | ICD-10-CM

## 2015-10-27 DIAGNOSIS — M79606 Pain in leg, unspecified: Secondary | ICD-10-CM | POA: Insufficient documentation

## 2015-10-27 MED ORDER — CEFAZOLIN SODIUM 1 G IJ SOLR
INTRAMUSCULAR | Status: AC
Start: 2015-10-27 — End: 2015-10-27
  Administered 2015-10-27: 10:00:00
  Filled 2015-10-27: qty 10

## 2015-10-27 MED ORDER — TRIAMCINOLONE ACETONIDE 40 MG/ML IJ SUSP
40.0000 mg | Freq: Once | INTRAMUSCULAR | Status: AC
  Administered 2015-10-27: 40 mg
  Filled 2015-10-27: qty 1

## 2015-10-27 MED ORDER — CEFUROXIME AXETIL 250 MG PO TABS: 250.0000 mg | ORAL_TABLET | Freq: Two times a day (BID) | ORAL | 0 refills | Status: DC

## 2015-10-27 MED ORDER — BUPIVACAINE HCL (PF) 0.25 % IJ SOLN
30.0000 mL | Freq: Once | INTRAMUSCULAR | Status: AC
  Administered 2015-10-27: 30 mL
  Filled 2015-10-27: qty 30

## 2015-10-27 MED ORDER — HYDROCODONE-ACETAMINOPHEN 5-325 MG PO TABS: ORAL_TABLET | ORAL | 0 refills | Status: DC

## 2015-10-27 MED ORDER — MIDAZOLAM HCL 5 MG/5ML IJ SOLN
5.0000 mg | Freq: Once | INTRAMUSCULAR | Status: AC
  Administered 2015-10-27: 5 mg via INTRAVENOUS
  Filled 2015-10-27: qty 5

## 2015-10-27 MED ORDER — LIDOCAINE HCL (PF) 1 % IJ SOLN
10.0000 mL | Freq: Once | INTRAMUSCULAR | Status: AC
  Administered 2015-10-27: 10 mL via SUBCUTANEOUS
  Filled 2015-10-27: qty 10

## 2015-10-27 MED ORDER — FENTANYL CITRATE (PF) 100 MCG/2ML IJ SOLN
100.0000 ug | Freq: Once | INTRAMUSCULAR | Status: AC
  Administered 2015-10-27: 100 ug via INTRAVENOUS
  Filled 2015-10-27: qty 2

## 2015-10-27 MED ORDER — ORPHENADRINE CITRATE 30 MG/ML IJ SOLN
60.0000 mg | Freq: Once | INTRAMUSCULAR | Status: AC
  Administered 2015-10-27: 60 mg via INTRAMUSCULAR
  Filled 2015-10-27: qty 2

## 2015-10-27 MED ORDER — CEFAZOLIN IN D5W 1 GM/50ML IV SOLN
1.0000 g | Freq: Once | INTRAVENOUS | Status: AC
  Administered 2015-10-27: 1 g via INTRAVENOUS

## 2015-10-27 MED ORDER — LACTATED RINGERS IV SOLN
1000.0000 mL | INTRAVENOUS | Status: DC
  Administered 2015-10-27: 1000 mL via INTRAVENOUS

## 2015-10-27 NOTE — Progress Notes (Signed)
      PROCEDURE PERFORMED: Lumbosacral selective nerve root block   NOTE: The patient is a 55 y.o. female who returns to [place] for further evaluation and treatment of pain involving the lumbar and lower extremity region. The patient is status post left and right total knee replacement. There is been concern regarding component of patient's pain involving the lower extremities being due to complex regional pain syndrome area the patient has had more significant benefit from lumbosacral selective nerve root blocks compared to the lumbar sympathetic block. The risks, benefits, and expectations of the procedure have been explained to the patient who was understanding and in agreement with suggested treatment plan. We will proceed with lumbosacral selective nerve root blocks as discussed and as explained to the patient. The patient is understanding and in agreement with suggested treatment plan.   DESCRIPTION OF PROCEDURE: Lumbosacral selective nerve root block with IV Versed, IV fentanyl conscious sedation, EKG, blood pressure, pulse, capnography, and pulse oximetry monitoring. The procedure was performed with the patient in the prone position under fluoroscopic guidance. With the patient in the prone position, Betadine prep of proposed entry site was performed. Local anesthetic skin wheal of proposed needle entry site was prepared with 1.5% plain lidocaine with AP view of the lumbosacral spine.   PROCEDURE #1: Needle placement at the left L 2 vertebral body: A 22 -gauge needle was inserted at the inferior border of the transverse process of the vertebral body with needle placed medial to the midline of the transverse process on AP view of the lumbosacral spine.   NEEDLE PLACEMENT AT  L3, L4, and L5  VERTEBRAL BODY LEVELS  Needle  placement was accomplished at L3, L4, and L5  vertebral body levels on the left side exactly as was accomplished at the L2  vertebral body level  and utilizing  the same technique and under fluoroscopic guidance.    Needle placement was then verified on lateral view at all levels with needle tip documented to be in the posterior superior quadrant of the intervertebral foramen of  L 2, L3, L4, and L5. Following negative aspiration for heme and CSF at each level, each level was injected with 3 mL of 0.25% bupivacaine with Kenalog.   LUMBOSACRAL SELECTIVE NERVE ROOT BLOCKS THE THE  RIGHT SIDE  The procedure was performed on the right side exactly as was performed on the left side and at the same levels  Under fluoroscopic guidance and utilizing the same technique.    The patient tolerated the procedure well. A total of 10 mg of Kenalog was utilized for the procedure.   PLAN:  1. Medications: Will continue presently prescribed medications Neurontin Norflex and hydrocodone acetaminophen 2. The patient is to undergo follow-up evaluation with PCP Dr. Lavera Guise for evaluation of blood pressure and general medical condition status post procedure performed on today's visit. 3. Surgical follow-up evaluation. Has been addressed 4. Neurological evaluation. May consider PNCV EMG studies and other studies 5. May consider radiofrequency procedures, implantation type procedures and other treatment pending response to treatment and follow-up evaluation. 6. The patient has been advised do adhere to proper body mechanics and avoid activities which may aggravate condition. 7. The patient has been advised to call the Pain Management Center prior to scheduled return appointment should there be significant change in the patient's condition or should the patient have other concerns regarding condition prior to scheduled return appointment.

## 2015-10-27 NOTE — Patient Instructions (Addendum)
PLAN   Continue present medication Neurontin Norflex and hydrocodone acetaminophen . Please get Ceftin antibiotic today and begin taking Ceftin antibiotic today as prescribed  F/U PCP Dr. Rebecka Apley for evaliation of  BP and general medical  condition  F/U surgical evaluation as discussed  F/U neurological evaluation. May consider PNCV/EMG studies and other studies pending follow-up evaluations  May consider radiofrequency rhizolysis or intraspinal procedures pending response to present treatment and F/U evaluation   Patient to call Pain Management Center should patient have concerns prior to scheduled return appointment.Pain Management Discharge Instructions  General Discharge Instructions :  If you need to reach your doctor call: Monday-Friday 8:00 am - 4:00 pm at [phone] or toll free [phone].  After clinic hours [phone] to have operator reach doctor.  Bring all of your medication bottles to all your appointments in the pain clinic.  To cancel or reschedule your appointment with Pain Management please remember to call 24 hours in advance to avoid a fee.  Refer to the educational materials which you have been given on: General Risks, I had my Procedure. Discharge Instructions, Post Sedation.  Post Procedure Instructions:  The drugs you were given will stay in your system until tomorrow, so for the next 24 hours you should not drive, make any legal decisions or drink any alcoholic beverages.  You may eat anything you prefer, but it is better to start with liquids then soups and crackers, and gradually work up to solid foods.  Please notify your doctor immediately if you have any unusual bleeding, trouble breathing or pain that is not related to your normal pain.  Depending on the type of procedure that was done, some parts of your body may feel week and/or numb.  This usually clears up by tonight or the next day.  Walk with the use of an assistive device or  accompanied by an adult for the 24 hours.  You may use ice on the affected area for the first 24 hours.  Put ice in a Ziploc bag and cover with a towel and place against area 15 minutes on 15 minutes off.  You may switch to heat after 24 hours.GENERAL RISKS AND COMPLICATIONS  What are the risk, side effects and possible complications? Generally speaking, most procedures are safe.  However, with any procedure there are risks, side effects, and the possibility of complications.  The risks and complications are dependent upon the sites that are lesioned, or the type of nerve block to be performed.  The closer the procedure is to the spine, the more serious the risks are.  Great care is taken when placing the radio frequency needles, block needles or lesioning probes, but sometimes complications can occur. 1. Infection: Any time there is an injection through the skin, there is a risk of infection.  This is why sterile conditions are used for these blocks.  There are four possible types of infection. 1. Localized skin infection. 2. Central Nervous System Infection-This can be in the form of Meningitis, which can be deadly. 3. Epidural Infections-This can be in the form of an epidural abscess, which can cause pressure inside of the spine, causing compression of the spinal cord with subsequent paralysis. This would require an emergency surgery to decompress, and there are no guarantees that the patient would recover from the paralysis. 4. Discitis-This is an infection of the intervertebral discs.  It occurs in about 1% of discography procedures.  It is difficult to treat and it may lead to surgery.  2. Pain: the needles have to go through skin and soft tissues, will cause soreness.       3. Damage to internal structures:  The nerves to be lesioned may be near blood vessels or    other nerves which can be potentially damaged.       4. Bleeding: Bleeding is more common if the patient is taking blood  thinners such as  aspirin, Coumadin, Ticiid, Plavix, etc., or if he/she have some genetic predisposition  such as hemophilia. Bleeding into the spinal canal can cause compression of the spinal  cord with subsequent paralysis.  This would require an emergency surgery to  decompress and there are no guarantees that the patient would recover from the  paralysis.       5. Pneumothorax:  Puncturing of a lung is a possibility, every time a needle is introduced in  the area of the chest or upper back.  Pneumothorax refers to free air around the  collapsed lung(s), inside of the thoracic cavity (chest cavity).  Another two possible  complications related to a similar event would include: Hemothorax and Chylothorax.   These are variations of the Pneumothorax, where instead of air around the collapsed  lung(s), you may have blood or chyle, respectively.       6. Spinal headaches: They may occur with any procedures in the area of the spine.       7. Persistent CSF (Cerebro-Spinal Fluid) leakage: This is a rare problem, but may occur  with prolonged intrathecal or epidural catheters either due to the formation of a fistulous  track or a dural tear.       8. Nerve damage: By working so close to the spinal cord, there is always a possibility of  nerve damage, which could be as serious as a permanent spinal cord injury with  paralysis.       9. Death:  Although rare, severe deadly allergic reactions known as "Anaphylactic  reaction" can occur to any of the medications used.      10. Worsening of the symptoms:  We can always make thing worse.  What are the chances of something like this happening? Chances of any of this occuring are extremely low.  By statistics, you have more of a chance of getting killed in a motor vehicle accident: while driving to the hospital than any of the above occurring .  Nevertheless, you should be aware that they are possibilities.  In general, it is similar to taking a shower.  Everybody knows  that you can slip, hit your head and get killed.  Does that mean that you should not shower again?  Nevertheless always keep in mind that statistics do not mean anything if you happen to be on the wrong side of them.  Even if a procedure has a 1 (one) in a 1,000,000 (million) chance of going wrong, it you happen to be that one..Also, keep in mind that by statistics, you have more of a chance of having something go wrong when taking medications.  Who should not have this procedure? If you are on a blood thinning medication (e.g. Coumadin, Plavix, see list of "Blood Thinners"), or if you have an active infection going on, you should not have the procedure.  If you are taking any blood thinners, please inform your physician.  How should I prepare for this procedure?  Do not eat or drink anything at least six hours prior to the procedure.  Bring a driver  with you .  It cannot be a taxi.  Come accompanied by an adult that can drive you back, and that is strong enough to help you if your legs get weak or numb from the local anesthetic.  Take all of your medicines the morning of the procedure with just enough water to swallow them.  If you have diabetes, make sure that you are scheduled to have your procedure done first thing in the morning, whenever possible.  If you have diabetes, take only half of your insulin dose and notify our nurse that you have done so as soon as you arrive at the clinic.  If you are diabetic, but only take blood sugar pills (oral hypoglycemic), then do not take them on the morning of your procedure.  You may take them after you have had the procedure.  Do not take aspirin or any aspirin-containing medications, at least eleven (11) days prior to the procedure.  They may prolong bleeding.  Wear loose fitting clothing that may be easy to take off and that you would not mind if it got stained with Betadine or blood.  Do not wear any jewelry or perfume  Remove any nail  coloring.  It will interfere with some of our monitoring equipment.  NOTE: Remember that this is not meant to be interpreted as a complete list of all possible complications.  Unforeseen problems may occur.  BLOOD THINNERS The following drugs contain aspirin or other products, which can cause increased bleeding during surgery and should not be taken for 2 weeks prior to and 1 week after surgery.  If you should need take something for relief of minor pain, you may take acetaminophen which is found in Tylenol,m Datril, Anacin-3 and Panadol. It is not blood thinner. The products listed below are.  Do not take any of the products listed below in addition to any listed on your instruction sheet.  A.P.C or A.P.C with Codeine Codeine Phosphate Capsules #3 Ibuprofen Ridaura  ABC compound Congesprin Imuran rimadil  Advil Cope Indocin Robaxisal  Alka-Seltzer Effervescent Pain Reliever and Antacid Coricidin or Coricidin-D  Indomethacin Rufen  Alka-Seltzer plus Cold Medicine Cosprin Ketoprofen S-A-C Tablets  Anacin Analgesic Tablets or Capsules Coumadin Korlgesic Salflex  Anacin Extra Strength Analgesic tablets or capsules CP-2 Tablets Lanoril Salicylate  Anaprox Cuprimine Capsules Levenox Salocol  Anexsia-D Dalteparin Magan Salsalate  Anodynos Darvon compound Magnesium Salicylate Sine-off  Ansaid Dasin Capsules Magsal Sodium Salicylate  Anturane Depen Capsules Marnal Soma  APF Arthritis pain formula Dewitt's Pills Measurin Stanback  Argesic Dia-Gesic Meclofenamic Sulfinpyrazone  Arthritis Bayer Timed Release Aspirin Diclofenac Meclomen Sulindac  Arthritis pain formula Anacin Dicumarol Medipren Supac  Analgesic (Safety coated) Arthralgen Diffunasal Mefanamic Suprofen  Arthritis Strength Bufferin Dihydrocodeine Mepro Compound Suprol  Arthropan liquid Dopirydamole Methcarbomol with Aspirin Synalgos  ASA tablets/Enseals Disalcid Micrainin Tagament  Ascriptin Doan's Midol Talwin  Ascriptin A/D Dolene  Mobidin Tanderil  Ascriptin Extra Strength Dolobid Moblgesic Ticlid  Ascriptin with Codeine Doloprin or Doloprin with Codeine Momentum Tolectin  Asperbuf Duoprin Mono-gesic Trendar  Aspergum Duradyne Motrin or Motrin IB Triminicin  Aspirin plain, buffered or enteric coated Durasal Myochrisine Trigesic  Aspirin Suppositories Easprin Nalfon Trillsate  Aspirin with Codeine Ecotrin Regular or Extra Strength Naprosyn Uracel  Atromid-S Efficin Naproxen Ursinus  Auranofin Capsules Elmiron Neocylate Vanquish  Axotal Emagrin Norgesic Verin  Azathioprine Empirin or Empirin with Codeine Normiflo Vitamin E  Azolid Emprazil Nuprin Voltaren  Bayer Aspirin plain, buffered or children's or timed BC Tablets or powders  Encaprin Orgaran Warfarin Sodium  Buff-a-Comp Enoxaparin Orudis Zorpin  Buff-a-Comp with Codeine Equegesic Os-Cal-Gesic   Buffaprin Excedrin plain, buffered or Extra Strength Oxalid   Bufferin Arthritis Strength Feldene Oxphenbutazone   Bufferin plain or Extra Strength Feldene Capsules Oxycodone with Aspirin   Bufferin with Codeine Fenoprofen Fenoprofen Pabalate or Pabalate-SF   Buffets II Flogesic Panagesic   Buffinol plain or Extra Strength Florinal or Florinal with Codeine Panwarfarin   Buf-Tabs Flurbiprofen Penicillamine   Butalbital Compound Four-way cold tablets Penicillin   Butazolidin Fragmin Pepto-Bismol   Carbenicillin Geminisyn Percodan   Carna Arthritis Reliever Geopen Persantine   Carprofen Gold's salt Persistin   Chloramphenicol Goody's Phenylbutazone   Chloromycetin Haltrain Piroxlcam   Clmetidine heparin Plaquenil   Cllnoril Hyco-pap Ponstel   Clofibrate Hydroxy chloroquine Propoxyphen         Before stopping any of these medications, be sure to consult the physician who ordered them.  Some, such as Coumadin (Warfarin) are ordered to prevent or treat serious conditions such as "deep thrombosis", "pumonary embolisms", and other heart problems.  The amount of time that  you may need off of the medication may also vary with the medication and the reason for which you were taking it.  If you are taking any of these medications, please make sure you notify your pain physician before you undergo any procedures.  Hydrocodone prescription given

## 2015-10-28 ENCOUNTER — Telehealth: Payer: Self-pay | Admitting: *Deleted

## 2015-10-28 NOTE — Telephone Encounter (Signed)
Spoke with patient re; procedure on yesterday, verbalizes no questions or concerns.  

## 2015-11-02 DIAGNOSIS — J180 Bronchopneumonia, unspecified organism: Secondary | ICD-10-CM

## 2015-11-02 HISTORY — DX: Bronchopneumonia, unspecified organism: J18.0

## 2015-11-22 ENCOUNTER — Ambulatory Visit: Payer: Managed Care, Other (non HMO) | Admitting: Pain Medicine

## 2015-11-25 ENCOUNTER — Other Ambulatory Visit: Payer: Self-pay | Admitting: Pain Medicine

## 2015-12-02 ENCOUNTER — Encounter: Payer: Self-pay | Admitting: Pain Medicine

## 2015-12-02 ENCOUNTER — Ambulatory Visit: Payer: Managed Care, Other (non HMO) | Attending: Pain Medicine | Admitting: Pain Medicine

## 2015-12-02 VITALS — BP 127/77 | HR 80 | Temp 98.3°F | Resp 16 | Ht 63.0 in | Wt 165.0 lb

## 2015-12-02 DIAGNOSIS — Z96653 Presence of artificial knee joint, bilateral: Secondary | ICD-10-CM | POA: Diagnosis not present

## 2015-12-02 DIAGNOSIS — M7060 Trochanteric bursitis, unspecified hip: Secondary | ICD-10-CM | POA: Insufficient documentation

## 2015-12-02 DIAGNOSIS — G9050 Complex regional pain syndrome I, unspecified: Secondary | ICD-10-CM

## 2015-12-02 DIAGNOSIS — M5416 Radiculopathy, lumbar region: Secondary | ICD-10-CM

## 2015-12-02 DIAGNOSIS — M545 Low back pain, unspecified: Secondary | ICD-10-CM

## 2015-12-02 DIAGNOSIS — J189 Pneumonia, unspecified organism: Secondary | ICD-10-CM | POA: Diagnosis not present

## 2015-12-02 DIAGNOSIS — M79605 Pain in left leg: Secondary | ICD-10-CM | POA: Diagnosis present

## 2015-12-02 DIAGNOSIS — M47816 Spondylosis without myelopathy or radiculopathy, lumbar region: Secondary | ICD-10-CM | POA: Insufficient documentation

## 2015-12-02 DIAGNOSIS — M5136 Other intervertebral disc degeneration, lumbar region: Secondary | ICD-10-CM | POA: Insufficient documentation

## 2015-12-02 DIAGNOSIS — G90523 Complex regional pain syndrome I of lower limb, bilateral: Secondary | ICD-10-CM | POA: Diagnosis not present

## 2015-12-02 MED ORDER — GABAPENTIN 300 MG PO CAPS: ORAL_CAPSULE | ORAL | 2 refills | Status: AC

## 2015-12-02 MED ORDER — HYDROCODONE-ACETAMINOPHEN 5-325 MG PO TABS: ORAL_TABLET | ORAL | 0 refills | Status: DC

## 2015-12-02 MED ORDER — ORPHENADRINE CITRATE ER 100 MG PO TB12: ORAL_TABLET | ORAL | 2 refills | Status: AC

## 2015-12-02 NOTE — Patient Instructions (Signed)
PLAN   Continue present medication Neurontin Norflex and hydrocodone acetaminophen Please apply Voltaren gel as discussed  May consider genicular nerve blocks of the knee as discussed. We will further discuss genicular nerve blocks of the knee on follow-up appointments  F/U PCP Dr. Rebecka Apley for evaliation of  BP and general medical condition   F/U surgical evaluation May consider further orthopedic evaluation of knees especially as discussed  F/U neurological evaluation. May consider PNCV/EMG studies and other studies pending follow-up evaluations  Ask the nurses and secretary the date of your orthopedic evaluation of your total knee replacements as discussed  May consider radiofrequency rhizolysis or intraspinal procedures pending response to present treatment and F/U evaluation   Patient to call Pain Management Center should patient have concerns prior to scheduled return appointment

## 2015-12-02 NOTE — Progress Notes (Signed)
Patient here for medication management.    Patient is currently being treated for bronchial pneumonia.  Safety precautions to be maintained throughout the outpatient stay will include: orient to surroundings, keep bed in low position, maintain call bell within reach at all times, provide assistance with transfer out of bed and ambulation.

## 2015-12-02 NOTE — Progress Notes (Signed)
    The patient is a 55 year old female who returns to pain management for further evaluation and treatment of pain involving the lower extremity region status post left and right total knee replacement. There is been concern regarding component of patient's pain being due to complex regional pain syndrome. The patient has had significant improvement of her pain with interventional treatment with the lumbosacral selective nerve root blocks being more affected than the lumbar sympathetic blocks in terms of relieving patient's symptoms. We will consider patient for additional interventional treatment once patient's general medical condition improves. The patient is with pneumonia at this time and will undergo follow-up evaluation with Dr.Masoud for further assessment and treatment of her condition. We may consider genicular nerve blocks of the knee for treatment of pain of the knees as discussed and explained to patient. The patient was with understanding and we'll continue present medications consisting of Neurontin Norflex and hydrocodone acetaminophen. The patient will also apply Voltaren gel to her knees.  All were understanding and in agreement with suggested treatment plan     Physical examination  Was tenderness to palpation of the paraspinal muscular region cervical region cervical facet region a mild degree with mild tenderness of the splenius capitis and occipitalis regions. Palpation of the acromioclavicular and glenohumeral joint regions reproduce mild discomfort as well. The patient was able to perform drop test without significant difficulty. The patient appeared to be with unremarkable Spurling's maneuver. Palpation over the thoracic region was with tenderness to palpation of mild degree with no crepitus of the thoracic region noted. Bar paraspinal must reason lumbar facet region was attends to palpation of mild to moderate degree with palpation over the PSIS and PII S region reproducing  moderate discomfort. There was moderate tenderness of the greater trochanteric region iliotibial band regions. Palpation of the knees reveal patient to be with tenderness to palpation with well-healed surgical scars of the knees without increased warmth or erythema of the knees noted. There were no new lesions of the skin noted. There was mild tenderness of the knees noted on today's evaluation. EHL strength appeared to be decreased. No definite sensory deficit or dermatomal distribution detected. There was negative clonus negative Homans. Abdomen without tenderness to palpation and no costovertebral tenderness noted.     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 We will further discuss genicular nerve blocks of the knee on follow-up appointments  F/U PCP Dr. Rebecka Apley for evaliation of  BP pneumonia and general medical condition   F/U surgical evaluation May consider further orthopedic evaluation of knees especially as discussed  F/U neurological evaluation. May consider PNCV/EMG studies and other studies pending follow-up evaluations  Ask the nurses and secretary the date of your orthopedic evaluation of your total knee replacements as discussed  May consider radiofrequency rhizolysis or intraspinal procedures pending response to present treatment and F/U evaluation   Patient to call Pain Management Center should patient have concerns prior to scheduled return appointment

## 2016-01-18 ENCOUNTER — Other Ambulatory Visit: Payer: Self-pay | Admitting: Internal Medicine

## 2016-01-18 DIAGNOSIS — R634 Abnormal weight loss: Secondary | ICD-10-CM

## 2016-01-18 DIAGNOSIS — R109 Unspecified abdominal pain: Secondary | ICD-10-CM

## 2016-01-21 ENCOUNTER — Ambulatory Visit
Admission: RE | Admit: 2016-01-21 | Discharge: 2016-01-21 | Disposition: A | Discharge status: Home / Self Care | Payer: Managed Care, Other (non HMO) | Source: Ambulatory Visit | Attending: Internal Medicine | Admitting: Internal Medicine

## 2016-01-21 ENCOUNTER — Other Ambulatory Visit: Payer: Self-pay | Admitting: Internal Medicine

## 2016-01-21 DIAGNOSIS — R109 Unspecified abdominal pain: Secondary | ICD-10-CM

## 2016-01-21 DIAGNOSIS — R634 Abnormal weight loss: Secondary | ICD-10-CM | POA: Diagnosis present

## 2016-01-21 DIAGNOSIS — M549 Dorsalgia, unspecified: Secondary | ICD-10-CM

## 2016-01-21 DIAGNOSIS — N281 Cyst of kidney, acquired: Secondary | ICD-10-CM | POA: Diagnosis not present

## 2016-01-21 DIAGNOSIS — K769 Liver disease, unspecified: Secondary | ICD-10-CM | POA: Insufficient documentation

## 2016-01-21 DIAGNOSIS — M4184 Other forms of scoliosis, thoracic region: Secondary | ICD-10-CM | POA: Insufficient documentation

## 2016-01-26 ENCOUNTER — Other Ambulatory Visit: Payer: Self-pay | Admitting: Internal Medicine

## 2016-01-26 DIAGNOSIS — K769 Liver disease, unspecified: Secondary | ICD-10-CM

## 2016-01-26 DIAGNOSIS — R634 Abnormal weight loss: Secondary | ICD-10-CM

## 2016-02-04 ENCOUNTER — Ambulatory Visit
Admission: RE | Admit: 2016-02-04 | Discharge: 2016-02-04 | Disposition: A | Discharge status: Home / Self Care | Payer: Managed Care, Other (non HMO) | Source: Ambulatory Visit | Attending: Internal Medicine | Admitting: Internal Medicine

## 2016-02-04 DIAGNOSIS — D3501 Benign neoplasm of right adrenal gland: Secondary | ICD-10-CM | POA: Diagnosis not present

## 2016-02-04 DIAGNOSIS — R634 Abnormal weight loss: Secondary | ICD-10-CM | POA: Diagnosis present

## 2016-02-04 DIAGNOSIS — N281 Cyst of kidney, acquired: Secondary | ICD-10-CM | POA: Insufficient documentation

## 2016-02-04 DIAGNOSIS — D1803 Hemangioma of intra-abdominal structures: Secondary | ICD-10-CM | POA: Insufficient documentation

## 2016-02-04 DIAGNOSIS — K769 Liver disease, unspecified: Secondary | ICD-10-CM | POA: Insufficient documentation

## 2016-02-04 MED ORDER — GADOBENATE DIMEGLUMINE 529 MG/ML IV SOLN
15.0000 mL | Freq: Once | INTRAVENOUS | Status: AC | PRN
  Administered 2016-02-04: 15 mL via INTRAVENOUS

## 2016-02-08 LAB — POCT I-STAT CREATININE: Creatinine, Ser: 0.6 mg/dL (ref 0.44–1.00)

## 2016-03-07 ENCOUNTER — Inpatient Hospital Stay
Admission: EM | Admit: 2016-03-07 | Discharge: 2016-03-11 | DRG: 438 | Disposition: A | Discharge status: Home Health | Payer: Managed Care, Other (non HMO) | Attending: Internal Medicine | Admitting: Internal Medicine

## 2016-03-07 ENCOUNTER — Emergency Department: Payer: Managed Care, Other (non HMO)

## 2016-03-07 ENCOUNTER — Encounter: Payer: Self-pay | Admitting: Emergency Medicine

## 2016-03-07 DIAGNOSIS — R945 Abnormal results of liver function studies: Secondary | ICD-10-CM

## 2016-03-07 DIAGNOSIS — Z79899 Other long term (current) drug therapy: Secondary | ICD-10-CM | POA: Diagnosis not present

## 2016-03-07 DIAGNOSIS — K839 Disease of biliary tract, unspecified: Secondary | ICD-10-CM | POA: Diagnosis not present

## 2016-03-07 DIAGNOSIS — C259 Malignant neoplasm of pancreas, unspecified: Secondary | ICD-10-CM | POA: Diagnosis present

## 2016-03-07 DIAGNOSIS — K831 Obstruction of bile duct: Secondary | ICD-10-CM | POA: Diagnosis present

## 2016-03-07 DIAGNOSIS — Z96653 Presence of artificial knee joint, bilateral: Secondary | ICD-10-CM | POA: Diagnosis present

## 2016-03-07 DIAGNOSIS — R17 Unspecified jaundice: Secondary | ICD-10-CM

## 2016-03-07 DIAGNOSIS — D490 Neoplasm of unspecified behavior of digestive system: Secondary | ICD-10-CM | POA: Diagnosis not present

## 2016-03-07 DIAGNOSIS — E43 Unspecified severe protein-calorie malnutrition: Secondary | ICD-10-CM | POA: Diagnosis present

## 2016-03-07 DIAGNOSIS — Z6830 Body mass index (BMI) 30.0-30.9, adult: Secondary | ICD-10-CM

## 2016-03-07 DIAGNOSIS — K859 Acute pancreatitis without necrosis or infection, unspecified: Principal | ICD-10-CM

## 2016-03-07 DIAGNOSIS — R1030 Lower abdominal pain, unspecified: Secondary | ICD-10-CM

## 2016-03-07 DIAGNOSIS — K858 Other acute pancreatitis without necrosis or infection: Secondary | ICD-10-CM | POA: Diagnosis not present

## 2016-03-07 DIAGNOSIS — E785 Hyperlipidemia, unspecified: Secondary | ICD-10-CM | POA: Diagnosis present

## 2016-03-07 DIAGNOSIS — E876 Hypokalemia: Secondary | ICD-10-CM

## 2016-03-07 DIAGNOSIS — K838 Other specified diseases of biliary tract: Secondary | ICD-10-CM | POA: Diagnosis present

## 2016-03-07 DIAGNOSIS — R7989 Other specified abnormal findings of blood chemistry: Secondary | ICD-10-CM

## 2016-03-07 DIAGNOSIS — K8500 Idiopathic acute pancreatitis without necrosis or infection: Secondary | ICD-10-CM | POA: Diagnosis not present

## 2016-03-07 DIAGNOSIS — I10 Essential (primary) hypertension: Secondary | ICD-10-CM | POA: Diagnosis present

## 2016-03-07 DIAGNOSIS — F17210 Nicotine dependence, cigarettes, uncomplicated: Secondary | ICD-10-CM | POA: Diagnosis present

## 2016-03-07 DIAGNOSIS — R109 Unspecified abdominal pain: Secondary | ICD-10-CM

## 2016-03-07 DIAGNOSIS — R1011 Right upper quadrant pain: Secondary | ICD-10-CM | POA: Diagnosis not present

## 2016-03-07 DIAGNOSIS — K819 Cholecystitis, unspecified: Secondary | ICD-10-CM

## 2016-03-07 DIAGNOSIS — K8689 Other specified diseases of pancreas: Secondary | ICD-10-CM

## 2016-03-07 DIAGNOSIS — R634 Abnormal weight loss: Secondary | ICD-10-CM

## 2016-03-07 DIAGNOSIS — R935 Abnormal findings on diagnostic imaging of other abdominal regions, including retroperitoneum: Secondary | ICD-10-CM

## 2016-03-07 DIAGNOSIS — Z8249 Family history of ischemic heart disease and other diseases of the circulatory system: Secondary | ICD-10-CM | POA: Diagnosis not present

## 2016-03-07 DIAGNOSIS — R110 Nausea: Secondary | ICD-10-CM

## 2016-03-07 HISTORY — DX: Headache, unspecified: R51.9

## 2016-03-07 HISTORY — DX: Headache: R51

## 2016-03-07 LAB — CBC
HCT: 48.2 % — ABNORMAL HIGH (ref 35.0–47.0)
Hemoglobin: 16.5 g/dL — ABNORMAL HIGH (ref 12.0–16.0)
MCH: 32.3 pg (ref 26.0–34.0)
MCHC: 34.2 g/dL (ref 32.0–36.0)
MCV: 94.4 fL (ref 80.0–100.0)
PLATELETS: 162 10*3/uL (ref 150–440)
RBC: 5.1 MIL/uL (ref 3.80–5.20)
RDW: 13.3 % (ref 11.5–14.5)
WBC: 9.1 10*3/uL (ref 3.6–11.0)

## 2016-03-07 LAB — COMPREHENSIVE METABOLIC PANEL
ALBUMIN: 3.7 g/dL (ref 3.5–5.0)
ALK PHOS: 89 U/L (ref 38–126)
ALT: 35 U/L (ref 14–54)
AST: 78 U/L — ABNORMAL HIGH (ref 15–41)
Anion gap: 10 (ref 5–15)
BILIRUBIN TOTAL: 4 mg/dL — AB (ref 0.3–1.2)
BUN: 18 mg/dL (ref 6–20)
CALCIUM: 9.6 mg/dL (ref 8.9–10.3)
CO2: 27 mmol/L (ref 22–32)
CREATININE: 0.99 mg/dL (ref 0.44–1.00)
Chloride: 100 mmol/L — ABNORMAL LOW (ref 101–111)
GFR calc Af Amer: >60 mL/min (ref >60)
GFR calc non Af Amer: >60 mL/min (ref >60)
GLUCOSE: 141 mg/dL — AB (ref 65–99)
Potassium: 2.5 mmol/L — CL (ref 3.5–5.1)
SODIUM: 137 mmol/L (ref 135–145)
TOTAL PROTEIN: 7 g/dL (ref 6.5–8.1)

## 2016-03-07 LAB — URINALYSIS COMPLETE WITH MICROSCOPIC (ARMC ONLY)
BILIRUBIN URINE: NEGATIVE
GLUCOSE, UA: NEGATIVE mg/dL
Ketones, ur: NEGATIVE mg/dL
Leukocytes, UA: NEGATIVE
Nitrite: NEGATIVE
PH: 5 (ref 5.0–8.0)
Protein, ur: NEGATIVE mg/dL
Specific Gravity, Urine: 1.015 (ref 1.005–1.030)

## 2016-03-07 LAB — MAGNESIUM: Magnesium: 2 mg/dL (ref 1.7–2.4)

## 2016-03-07 LAB — LIPASE, BLOOD: Lipase: 86 U/L — ABNORMAL HIGH (ref 11–51)

## 2016-03-07 MED ORDER — MORPHINE SULFATE (PF) 4 MG/ML IV SOLN
2.0000 mg | INTRAVENOUS | Status: DC | PRN
  Administered 2016-03-07 – 2016-03-10 (×9): 2 mg via INTRAVENOUS
  Filled 2016-03-07 (×9): qty 1

## 2016-03-07 MED ORDER — SODIUM CHLORIDE 0.9 % IV SOLN: INTRAVENOUS | Status: DC

## 2016-03-07 MED ORDER — POTASSIUM CHLORIDE CRYS ER 20 MEQ PO TBCR
40.0000 meq | EXTENDED_RELEASE_TABLET | Freq: Once | ORAL | Status: AC
  Administered 2016-03-07: 40 meq via ORAL
  Filled 2016-03-07: qty 2

## 2016-03-07 MED ORDER — HYDROMORPHONE HCL 1 MG/ML IJ SOLN
1.0000 mg | Freq: Once | INTRAMUSCULAR | Status: AC
  Administered 2016-03-07: 1 mg via INTRAVENOUS
  Filled 2016-03-07: qty 1

## 2016-03-07 MED ORDER — IOPAMIDOL (ISOVUE-300) INJECTION 61%
30.0000 mL | Freq: Once | INTRAVENOUS | Status: AC
  Administered 2016-03-07: 30 mL via ORAL

## 2016-03-07 MED ORDER — SODIUM CHLORIDE 0.9 % IV BOLUS (SEPSIS)
1000.0000 mL | Freq: Once | INTRAVENOUS | Status: AC
  Administered 2016-03-07: 1000 mL via INTRAVENOUS

## 2016-03-07 MED ORDER — ONDANSETRON HCL 4 MG/2ML IJ SOLN: 4.0000 mg | Freq: Four times a day (QID) | INTRAMUSCULAR | Status: DC | PRN

## 2016-03-07 MED ORDER — GABAPENTIN 300 MG PO CAPS
300.0000 mg | ORAL_CAPSULE | Freq: Two times a day (BID) | ORAL | Status: DC
  Administered 2016-03-07 – 2016-03-11 (×7): 300 mg via ORAL
  Filled 2016-03-07 (×7): qty 1

## 2016-03-07 MED ORDER — HEPARIN SODIUM (PORCINE) 5000 UNIT/ML IJ SOLN
5000.0000 [IU] | Freq: Three times a day (TID) | INTRAMUSCULAR | Status: DC
  Administered 2016-03-07 – 2016-03-09 (×5): 5000 [IU] via SUBCUTANEOUS
  Filled 2016-03-07 (×5): qty 1

## 2016-03-07 MED ORDER — FLUOXETINE HCL 20 MG PO CAPS
20.0000 mg | ORAL_CAPSULE | Freq: Every day | ORAL | Status: DC
  Administered 2016-03-08 – 2016-03-11 (×4): 20 mg via ORAL
  Filled 2016-03-07 (×6): qty 1

## 2016-03-07 MED ORDER — POTASSIUM CHLORIDE IN NACL 40-0.9 MEQ/L-% IV SOLN
INTRAVENOUS | Status: AC
  Administered 2016-03-08 (×2): 100 mL/h via INTRAVENOUS
  Filled 2016-03-07 (×3): qty 1000

## 2016-03-07 MED ORDER — ONDANSETRON HCL 4 MG/2ML IJ SOLN
4.0000 mg | Freq: Once | INTRAMUSCULAR | Status: AC
  Administered 2016-03-07: 4 mg via INTRAVENOUS
  Filled 2016-03-07: qty 2

## 2016-03-07 MED ORDER — MORPHINE SULFATE (PF) 4 MG/ML IV SOLN
4.0000 mg | Freq: Once | INTRAVENOUS | Status: AC
  Administered 2016-03-07: 4 mg via INTRAVENOUS
  Filled 2016-03-07: qty 1

## 2016-03-07 MED ORDER — POTASSIUM CHLORIDE CRYS ER 20 MEQ PO TBCR
20.0000 meq | EXTENDED_RELEASE_TABLET | Freq: Two times a day (BID) | ORAL | Status: DC
  Administered 2016-03-07 (×2): 20 meq via ORAL
  Filled 2016-03-07 (×2): qty 1

## 2016-03-07 MED ORDER — SODIUM CHLORIDE 0.9 % IV SOLN: Freq: Once | INTRAVENOUS | Status: DC

## 2016-03-07 MED ORDER — POTASSIUM CHLORIDE IN NACL 40-0.9 MEQ/L-% IV SOLN
INTRAVENOUS | Status: AC
  Filled 2016-03-07: qty 1000

## 2016-03-07 MED ORDER — POTASSIUM CHLORIDE 2 MEQ/ML IV SOLN
30.0000 meq | Freq: Once | INTRAVENOUS | Status: AC
  Administered 2016-03-07: 30 meq via INTRAVENOUS
  Filled 2016-03-07: qty 15

## 2016-03-07 MED ORDER — IOPAMIDOL (ISOVUE-300) INJECTION 61%
100.0000 mL | Freq: Once | INTRAVENOUS | Status: AC | PRN
  Administered 2016-03-07: 100 mL via INTRAVENOUS

## 2016-03-07 MED ORDER — POTASSIUM CHLORIDE IN NACL 40-0.9 MEQ/L-% IV SOLN
INTRAVENOUS | Status: DC
  Administered 2016-03-07: 100 mL/h via INTRAVENOUS
  Filled 2016-03-07: qty 1000

## 2016-03-07 NOTE — ED Triage Notes (Signed)
Patient presents to the ED with abdominal pain x 10 weeks.  Patient reports appointment with surgeon tomorrow.  Patient reports pain has been getting worse over the past few days.  Patient denies vomiting and diarrhea.  Patient reports being followed by Dr. Lavera Guise for abdominal pain.

## 2016-03-07 NOTE — H&P (Signed)
[facility] NAME: Michelle Conway    MR#:  [id number]  DATE OF BIRTH:  1960-07-19  DATE OF ADMISSION:  03/07/2016  PRIMARY CARE PHYSICIAN: Cletis Athens, MD   REQUESTING/REFERRING PHYSICIAN: Lord  CHIEF COMPLAINT:   Chief Complaint  Patient presents with  . Abdominal Pain    HISTORY OF PRESENT ILLNESS: Michelle Conway  is a 55 y.o. female with a known history of Arthritis, bronchial pneumonia, hyperlipidemia, hypertension- started having generalized abdominal pain for last 2 and half months, pain is constant but fluctuating, associated with nausea and almost 50 pound weight loss in last 2 months. Her primary care physician did initial workups but was not able to find any significant reasons so she was referred to a surgical clinic- where she had appointment next week. Her pain was getting worse and she could not tolerate it so came to emergency room today. She was noted to have elevated bilirubin and lipase level and on CT and abdominal ultrasound she was noted to have possibly pancreatic head mass or obstructed CBD, so she is given his admission to hospitalist team.  PAST MEDICAL HISTORY:   Past Medical History:  Diagnosis Date  . Allergy   . Arthritis   . Bronchial pneumonia 11/2015   patient currently being treated  . Hyperlipidemia   . Hypertension     PAST SURGICAL HISTORY: Past Surgical History:  Procedure Laterality Date  . ABDOMINAL HYSTERECTOMY    . JOINT REPLACEMENT Bilateral    knees  . KNEE SURGERY Right   . KNEE SURGERY Left   . TONSILLECTOMY Bilateral   . TONSILLECTOMY AND ADENOIDECTOMY N/A     SOCIAL HISTORY:  Social History  Substance Use Topics  . Smoking status: Heavy Tobacco Smoker    Packs/day: 0.50  . Smokeless tobacco: Never Used  . Alcohol use No    FAMILY HISTORY:  Family History  Problem Relation Age of Onset  . Heart disease Mother   . Diabetes Mother   . Kidney disease Mother   . Heart  disease Father   . Muscular dystrophy Father     DRUG ALLERGIES:  Allergies  Allergen Reactions  . No Known Allergies     REVIEW OF SYSTEMS:   CONSTITUTIONAL: No fever, fatigue or weakness.  EYES: No blurred or double vision.  EARS, NOSE, AND THROAT: No tinnitus or ear pain.  RESPIRATORY: No cough, shortness of breath, wheezing or hemoptysis.  CARDIOVASCULAR: No chest pain, orthopnea, edema.  GASTROINTESTINAL: Positive for nausea, no vomiting, diarrhea , she have abdominal pain.  GENITOURINARY: No dysuria, hematuria.  ENDOCRINE: No polyuria, nocturia,  HEMATOLOGY: No anemia, easy bruising or bleeding SKIN: No rash or lesion. MUSCULOSKELETAL: No joint pain or arthritis.   NEUROLOGIC: No tingling, numbness, weakness.  PSYCHIATRY: No anxiety or depression.   MEDICATIONS AT HOME:  Prior to Admission medications   Medication Sig Start Date End Date Taking? Authorizing Provider  benzonatate (TESSALON) 100 MG capsule Take by mouth 3 (three) times daily.   Yes Historical Provider, MD  cetirizine (ZYRTEC) 10 MG tablet Take 10 mg by mouth daily as needed. Reported on 08/02/2015   Yes Historical Provider, MD  FLUoxetine (PROZAC) 20 MG tablet Take 20 mg by mouth daily.   Yes Historical Provider, MD  gabapentin (NEURONTIN) 300 MG capsule Limit 1 tablet by mouth per day or twice a day if tolerated Patient taking differently: Take 300 mg by mouth 2 (two) times daily. Limit 1 tablet  by mouth per day or twice a day if tolerated 12/02/15  Yes Mohammed Kindle, MD  HYDROcodone-acetaminophen (NORCO/VICODIN) 5-325 MG tablet Limit one tab po bid if tolerated. Patient taking differently: Take 1 tablet by mouth 2 (two) times daily. Limit one tab po bid if tolerated. 12/02/15  Yes Mohammed Kindle, MD  lisinopril-hydrochlorothiazide (PRINZIDE,ZESTORETIC) 20-25 MG per tablet Take 1 tablet by mouth daily.   Yes Historical Provider, MD  orphenadrine (NORFLEX) 100 MG tablet Limit 1 tablet by mouth per day or twice per  day if tolerated 12/02/15  Yes Mohammed Kindle, MD  RABEprazole (ACIPHEX) 20 MG tablet Take 20 mg by mouth daily.   Yes Historical Provider, MD  simvastatin (ZOCOR) 40 MG tablet Take 20 mg by mouth daily.   Yes Historical Provider, MD  topiramate (TOPAMAX) 25 MG tablet Take 25 mg by mouth 2 (two) times daily as needed.    Yes Historical Provider, MD      PHYSICAL EXAMINATION:   VITAL SIGNS: Blood pressure 115/68, pulse (!) 57, temperature 98.5 F (36.9 C), temperature source Oral, resp. rate 14, height 5\' 3"  (1.6 m), weight 74.8 kg (165 lb), SpO2 93 %.  GENERAL:  55 y.o.-year-old patient lying in the bed with no acute distress.  EYES: Pupils equal, round, reactive to light and accommodation. No scleral icterus. Extraocular muscles intact.  HEENT: Head atraumatic, normocephalic. Oropharynx and nasopharynx clear.  NECK:  Supple, no jugular venous distention. No thyroid enlargement, no tenderness.  LUNGS: Normal breath sounds bilaterally, no wheezing, rales,rhonchi or crepitation. No use of accessory muscles of respiration.  CARDIOVASCULAR: S1, S2 normal. No murmurs, rubs, or gallops.  ABDOMEN: Soft, generalized tenderness and guarding present, nondistended. Bowel sounds present. No organomegaly or mass.  EXTREMITIES: No pedal edema, cyanosis, or clubbing.  NEUROLOGIC: Cranial nerves II through XII are intact. Muscle strength 5/5 in all extremities. Sensation intact. Gait not checked.  PSYCHIATRIC: The patient is alert and oriented x 3.  SKIN: No obvious rash, lesion, or ulcer.   LABORATORY PANEL:   CBC  Recent Labs Lab 03/07/16 0714  WBC 9.1  HGB 16.5*  HCT 48.2*  PLT 162  MCV 94.4  MCH 32.3  MCHC 34.2  RDW 13.3   ------------------------------------------------------------------------------------------------------------------  Chemistries   Recent Labs Lab 03/07/16 0714  NA 137  K 2.5*  CL 100*  CO2 27  GLUCOSE 141*  BUN 18  CREATININE 0.99  CALCIUM 9.6  AST 78*   ALT 35  ALKPHOS 89  BILITOT 4.0*   ------------------------------------------------------------------------------------------------------------------ estimated creatinine clearance is 60.8 mL/min (by C-G formula based on SCr of 0.99 mg/dL). ------------------------------------------------------------------------------------------------------------------ No results for input(s): TSH, T4TOTAL, T3FREE, THYROIDAB in the last 72 hours.  Invalid input(s): FREET3   Coagulation profile No results for input(s): INR, PROTIME in the last 168 hours. ------------------------------------------------------------------------------------------------------------------- No results for input(s): DDIMER in the last 72 hours. -------------------------------------------------------------------------------------------------------------------  Cardiac Enzymes No results for input(s): CKMB, TROPONINI, MYOGLOBIN in the last 168 hours.  Invalid input(s): CK ------------------------------------------------------------------------------------------------------------------ Invalid input(s): POCBNP  ---------------------------------------------------------------------------------------------------------------  Urinalysis    Component Value Date/Time   COLORURINE AMBER (A) 03/07/2016 0714   APPEARANCEUR CLEAR (A) 03/07/2016 0714   LABSPEC 1.015 03/07/2016 0714   PHURINE 5.0 03/07/2016 0714   GLUCOSEU NEGATIVE 03/07/2016 0714   HGBUR 3+ (A) 03/07/2016 0714   BILIRUBINUR NEGATIVE 03/07/2016 0714   KETONESUR NEGATIVE 03/07/2016 0714   PROTEINUR NEGATIVE 03/07/2016 0714   NITRITE NEGATIVE 03/07/2016 0714   LEUKOCYTESUR NEGATIVE 03/07/2016 0714     RADIOLOGY: Ct Abdomen  Pelvis W Contrast  Result Date: 03/07/2016 CLINICAL DATA:  Abdomen pain for 6 weeks. 50 pound weight loss in 10 weeks. EXAM: CT ABDOMEN AND PELVIS WITH CONTRAST TECHNIQUE: Multidetector CT imaging of the abdomen and pelvis was performed  using the standard protocol following bolus administration of intravenous contrast. CONTRAST:  121mL ISOVUE-300 IOPAMIDOL (ISOVUE-300) INJECTION 61% COMPARISON:  MRI of the abdomen February 04, 2016 FINDINGS: Lower chest: There is minimal dependent atelectasis of the posterior lung bases. No focal pneumonia is identified. The heart size is normal. Hepatobiliary: There is a low-density lesion in the lateral posterior segment right lobe liver unchanged compared to prior MRI where it was described as a hemangioma. There is mild intrahepatic biliary ductal dilatation. The common bile duct measures 10 mm. The gallbladder demonstrates a thickened wall with surrounding fluid. Pancreas: There is patchy low density with stranding surrounding the pancreatic head. Spleen: Normal in size without focal abnormality. Adrenals/Urinary Tract: Low-density masses are identified in bilateral adrenal glands consistent with previous MRI noted bilateral adrenal adenomas. Left kidney cysts is identified unchanged. The right kidney is normal. Stomach/Bowel: Stomach is within normal limits. Appendix appears normal. No evidence of bowel wall thickening, distention, or inflammatory changes. Vascular/Lymphatic: Aortic atherosclerosis. No enlarged abdominal or pelvic lymph nodes. Reproductive: Status post hysterectomy. No adnexal masses. Other: There is small umbilical herniation of mesenteric fat. Musculoskeletal: Degenerative joint changes of the spine are noted. IMPRESSION: Low-density identified in the pancreatic head with surrounding stranding ; the findings could be due to pancreatitis but low-density/partial cystic pancreatic mass is not excluded. There is gallbladder wall thickening with pericholecystic fluid suspicious for cholecystitis. Intra and extrahepatic biliary ductal dilatation identified. Low-density lesion in the lateral posterior segment right lobe liver unchanged compared to prior MRI where it was described as a hemangioma.  Low-density masses in bilateral adrenal glands unchanged compared to previous recent MRI where the lesions were described as bilateral adrenal adenomas. Electronically Signed   By: Abelardo Diesel M.D.   On: 03/07/2016 11:32   US Abdomen Limited Ruq  Result Date: 03/07/2016 CLINICAL DATA:  Pancreatitis ; jaundice; abdominal pain. EXAM: US ABDOMEN LIMITED - RIGHT UPPER QUADRANT COMPARISON:  03/07/2016 CT scan FINDINGS: Gallbladder: Gallbladder wall thickening at 5 mm, with trace pericholecystic fluid. Sludge in the gallbladder, without well-defined stones. Sonographic Murphy sign indeterminate due to pain medication. Common bile duct: Diameter: 9 mm Liver: Subtle intrahepatic biliary dilatation. A dominant 3.8 by 3.6 by 2.1 cm lesion peripherally in the right hepatic lobe is hyperechoic and compatible with the hemangioma shown previously at this location. The smaller hemangioma in the caudate lobe was not well seen on today's ultrasound. IMPRESSION: 1. Mild intrahepatic biliary dilatation with the dilated CBD at 9 mm, this is abnormally dilated, and could be the result of obstruction in the vicinity of the pancreatic head. Appearance on prior CT could possibly reflect a mass in the pancreatic head and adjacent body, versus focal pancreatitis. Even assuming the patient has signs and symptoms of focal pancreatitis, careful follow up to exclude pancreatic malignancy will be important. 2. There is gallbladder wall thickening at 5 mm with trace pericholecystic fluid, potentially from inflammation, correlate clinically in assessing for acute cholecystitis. Sludge noted in the gallbladder. Electronically Signed   By: Van Clines M.D.   On: 03/07/2016 12:46    EKG: Orders placed or performed during the hospital encounter of 03/07/16  . ED EKG  . ED EKG  . EKG 12-Lead  . EKG 12-Lead  IMPRESSION AND PLAN:  * Acute pancreatitis   IV fluids and keep nothing by mouth.   Symptomatic treatment for pain and  nausea.    * Elevated bilirubin, nausea and weight loss   There is a dilated common bile duct on radiological imaging.   Possibility of pancreatic head mass is also suspected.   GI and surgical consult for further management.     She may benefit from ERCP.  * Hypertension   Blood pressure is stable, monitor.  * Hypokalemia   Replace IV and oral, check magnesium.  * Smoking   Tobacco cessation counseling was done for 4 minutes by me offered nicotine patch.   All the records are reviewed and case discussed with ED provider. Management plans discussed with the patient, family and they are in agreement.  CODE STATUS:Full code.  Code Status History    This patient does not have a recorded code status. Please follow your organizational policy for patients in this situation.     Plan discussed with patient's husband was present in the room and with surgical and GI doctors.  TOTAL TIME TAKING CARE OF THIS PATIENT: 50  minutes.    Vaughan Basta M.D on 03/07/2016   Between 7am to 6pm - Pager - [phone]  After 6pm go to [email or web address] - password [email or web address] [facility]  Office  [phone]  CC: Primary care physician; Cletis Athens, MD   Note: This dictation was prepared with Dragon dictation along with smaller phrase technology. Any transcriptional errors that result from this process are unintentional.

## 2016-03-07 NOTE — ED Notes (Signed)
Surgery consult at bedside.

## 2016-03-07 NOTE — ED Notes (Signed)
Patient transported to CT 

## 2016-03-07 NOTE — Consult Note (Signed)
Patient ID: Michelle Conway, female   DOB: February 09, 1961, 55 y.o.   MRN: [id number]  HPI [name] is a 55 y.o. female asked to see in consultation for abdominal pain. She reports that over the last 2-1/2 months ago she has been experiencing intermittent abdominal pain. Pain is diffuse but mainly in the lower abdomen. There is no specific alleviating or aggravating factors. The only thing that she says that relief partially the pain was some heating pad to her back. She has asked and seated weight loss about 55 pounds as well as nausea . She has been workup recently by her primary care physician and this has included an ultrasound of the abdomen showing some hemangiomas and this prompted an MRI showing evidence of hemangiomas on the liver but no evidence of pancreatic mass is or gallbladder pathology. Upon evaluation in the emergency room a CT scan of the abdomen was performed and I have personally reviewed there is evidence of some stranding in the head of the pancreas with some biliary dilation. There is mildly distended gallbladder. There is no evidence of free air there is no evidence of abscess and there is no evidence of necrotizing pancreatitis. She did have an increased bilirubin up to 5 and a common bile duct of 9 mm on ultrasound. There was no stones seen on the ultrasound. She does have a personal history of chronic pain and that has been managed with a narcotic and block approach. She smokes daily half pack a day  HPI  Past Medical History:  Diagnosis Date  . Allergy   . Arthritis   . Bronchial pneumonia 11/2015   patient currently being treated  . Hyperlipidemia   . Hypertension     Past Surgical History:  Procedure Laterality Date  . ABDOMINAL HYSTERECTOMY    . JOINT REPLACEMENT Bilateral    knees  . KNEE SURGERY Right   . KNEE SURGERY Left   . TONSILLECTOMY Bilateral   . TONSILLECTOMY AND ADENOIDECTOMY N/A     Family History  Problem Relation Age of Onset  . Heart  disease Mother   . Diabetes Mother   . Kidney disease Mother   . Heart disease Father   . Muscular dystrophy Father     Social History Social History  Substance Use Topics  . Smoking status: Heavy Tobacco Smoker    Packs/day: 0.50  . Smokeless tobacco: Never Used  . Alcohol use No    Allergies  Allergen Reactions  . No Known Allergies     Current Facility-Administered Medications  Medication Dose Route Frequency Provider Last Rate Last Dose  . 0.9 % NaCl with KCl 40 mEq / L  infusion   Intravenous Continuous Vaughan Basta, MD 100 mL/hr at 03/07/16 1446 100 mL/hr at 03/07/16 1446  . bupivacaine (PF) (MARCAINE) 0.25 % injection 30 mL  30 mL Infiltration Once Mohammed Kindle, MD      . bupivacaine (PF) (MARCAINE) 0.25 % injection 30 mL  30 mL Other Once Mohammed Kindle, MD      . ceFAZolin (ANCEF) IVPB 1 g/50 mL premix  1 g Intravenous Once Mohammed Kindle, MD      . ceFAZolin (ANCEF) IVPB 1 g/50 mL premix  1 g Intravenous Once Mohammed Kindle, MD      . ceFAZolin (ANCEF) IVPB 1 g/50 mL premix  1 g Intravenous Once Mohammed Kindle, MD      . ceFAZolin (ANCEF) IVPB 1 g/50 mL premix  1 g Intravenous Once Belenda Cruise  Primus Bravo, MD      . fentaNYL (SUBLIMAZE) injection 100 mcg  100 mcg Intravenous Once Mohammed Kindle, MD      . fentaNYL (SUBLIMAZE) injection 100 mcg  100 mcg Intravenous Once Mohammed Kindle, MD      . lactated ringers infusion 1,000 mL  1,000 mL Intravenous Continuous Mohammed Kindle, MD      . lactated ringers infusion 1,000 mL  1,000 mL Intravenous Continuous Mohammed Kindle, MD      . lactated ringers infusion 1,000 mL  1,000 mL Intravenous Continuous Mohammed Kindle, MD      . lactated ringers infusion 1,000 mL  1,000 mL Intravenous Continuous Mohammed Kindle, MD      . lactated ringers infusion 1,000 mL  1,000 mL Intravenous Continuous Mohammed Kindle, MD      . lactated ringers infusion 1,000 mL  1,000 mL Intravenous Continuous Mohammed Kindle, MD 125 mL/hr at 10/27/15 0030 1,000 mL at  10/27/15 0030  . lidocaine (PF) (XYLOCAINE) 1 % injection 10 mL  10 mL Subcutaneous Once Mohammed Kindle, MD      . lidocaine (PF) (XYLOCAINE) 1 % injection 10 mL  10 mL Subcutaneous Once Mohammed Kindle, MD      . lidocaine (PF) (XYLOCAINE) 1 % injection 10 mL  10 mL Subcutaneous Once Mohammed Kindle, MD      . midazolam (VERSED) 5 MG/5ML injection 5 mg  5 mg Intravenous Once Mohammed Kindle, MD      . midazolam (VERSED) 5 MG/5ML injection 5 mg  5 mg Intravenous Once Mohammed Kindle, MD      . morphine 4 MG/ML injection 2 mg  2 mg Intravenous Q4H PRN Vaughan Basta, MD      . ondansetron (ZOFRAN) injection 4 mg  4 mg Intravenous Q6H PRN Vaughan Basta, MD      . orphenadrine (NORFLEX) injection 60 mg  60 mg Intramuscular Once Mohammed Kindle, MD      . potassium chloride SA (K-DUR,KLOR-CON) CR tablet 20 mEq  20 mEq Oral BID Vaughan Basta, MD      . triamcinolone acetonide (KENALOG-40) injection (RADIOLOGY ONLY) 40 mg  40 mg Intra-articular Once Mohammed Kindle, MD      . triamcinolone acetonide (KENALOG-40) injection 40 mg  40 mg Other Once Mohammed Kindle, MD       Current Outpatient Prescriptions  Medication Sig Dispense Refill  . benzonatate (TESSALON) 100 MG capsule Take by mouth 3 (three) times daily.    . cetirizine (ZYRTEC) 10 MG tablet Take 10 mg by mouth daily as needed. Reported on 08/02/2015    . FLUoxetine (PROZAC) 20 MG tablet Take 20 mg by mouth daily.    Marland Kitchen gabapentin (NEURONTIN) 300 MG capsule Limit 1 tablet by mouth per day or twice a day if tolerated (Patient taking differently: Take 300 mg by mouth 2 (two) times daily. Limit 1 tablet by mouth per day or twice a day if tolerated) 60 capsule 2  . HYDROcodone-acetaminophen (NORCO/VICODIN) 5-325 MG tablet Limit one tab po bid if tolerated. (Patient taking differently: Take 1 tablet by mouth 2 (two) times daily. Limit one tab po bid if tolerated.) 60 tablet 0  . lisinopril-hydrochlorothiazide (PRINZIDE,ZESTORETIC) 20-25 MG per  tablet Take 1 tablet by mouth daily.    . orphenadrine (NORFLEX) 100 MG tablet Limit 1 tablet by mouth per day or twice per day if tolerated 60 tablet 2  . RABEprazole (ACIPHEX) 20 MG tablet Take 20 mg by mouth daily.    . simvastatin (  ZOCOR) 40 MG tablet Take 20 mg by mouth daily.    Marland Kitchen topiramate (TOPAMAX) 25 MG tablet Take 25 mg by mouth 2 (two) times daily as needed.        Review of Systems A 10 point review of systems was asked and was negative except for the information on the HPI  Physical Exam Blood pressure 115/68, pulse (!) 57, temperature 98.5 F (36.9 C), temperature source Oral, resp. rate 14, height 5\' 3"  (1.6 m), weight 74.8 kg (165 lb), SpO2 93 %. CONSTITUTIONAL: NAD EYES: Pupils are equal, round, and reactive to light, Sclera are non-icteric. EARS, NOSE, MOUTH AND THROAT: The oropharynx is clear. The oral mucosa is pink and moist. Hearing is intact to voice. LYMPH NODES:  Lymph nodes in the neck are normal. RESPIRATORY:  Lungs are clear. There is normal respiratory effort, with equal breath sounds bilaterally, and without pathologic use of accessory muscles. CARDIOVASCULAR: Heart is regular without murmurs, gallops, or rubs. GI: The abdomen is soft, Mildly tender to palpation diffusely. No peritonitis. No Murphy sign GU: Rectal deferred.   MUSCULOSKELETAL: Normal muscle strength and tone. No cyanosis or edema.   SKIN: Turgor is good and there are no pathologic skin lesions or ulcers. NEUROLOGIC: Motor and sensation is grossly normal. Cranial nerves are grossly intact. PSYCH:  Oriented to person, place and time. Affect is normal.  Data Reviewed I have personally reviewed the patient's imaging, laboratory findings and medical records.    Assessment/Plan Pancreatitis versus pancreatic head malignancy and. Also a question about possible cholecystitis. Her story is not classic for any particular entity. The fact that she has been hurting for 2 and a month goes against acute  cholecystitis. She does have a lesion in the head of the pancreas as well as weight loss to suggest malignancy. Currently she's got an elevated lipase as well as an elevated bilirubin that may indicate biliary obstruction. Will asked GI to see her upon her for a possible MRCP or ERCP. I for now with no need for emergent surgical intervention. We need to figure out first and the culprit of her symptoms before doing any major surgical attention. If this pans out to be a pancreatic malignancy she will probably have to be transferred to a tertiary care center for [facility] or workup and treatment. I have discussed with the patient in detail extensively about her disease process and she understands. I also discussed with the hospitalist service in detail about her care  Caroleen Hamman, MD [facility] 03/07/2016, 2:58 PM

## 2016-03-07 NOTE — Consult Note (Signed)
Jonathon Bellows MD  [address] [place], [place] [address] Phone: [phone] Fax : [phone]  Consultation  Referring Provider:     No ref. provider found Primary Care Physician:  Cletis Athens, MD Primary Gastroenterologist:  Dr.          Luiz Iron for Consultation:     Abnormal LFT's  Date of Admission:  03/07/2016 Date of Consultation:  03/07/2016         HPI:   Michelle Conway is a 55 y.o. female presented to the ER with abdominal pain.   In the ER she underwent a CT scan that showed possible cholecystitis , intra and extra hepatic duct biliary dilation , CBD 9 mm , low density lesion in the pancreatic head with surrounding stranding which could be from pancreatitis as well as a mass that is not excluded.  Lipase 86 ,tbil 4.0 ,AST 78 ,ALT 35, Hb 16.5 .  MRI 02/04/16 showed a normal gall bladder as well as pancreas.   She says that all her abdominal pain began over the last 10 weeks, worse in the past 2 weeks, lower abdomen,continious, no worse with meals. Denies any prior episodes of pancreatitis in her self or family, no family history of pancreatic cancer. She does mention some chills and fever last few days.   Past Medical History:  Diagnosis Date  . Allergy   . Arthritis   . Bronchial pneumonia 11/2015   patient currently being treated  . Hyperlipidemia   . Hypertension     Past Surgical History:  Procedure Laterality Date  . ABDOMINAL HYSTERECTOMY    . JOINT REPLACEMENT Bilateral    knees  . KNEE SURGERY Right   . KNEE SURGERY Left   . TONSILLECTOMY Bilateral   . TONSILLECTOMY AND ADENOIDECTOMY N/A     Prior to Admission medications   Medication Sig Start Date End Date Taking? Authorizing Provider  benzonatate (TESSALON) 100 MG capsule Take by mouth 3 (three) times daily.   Yes Historical Provider, MD  cetirizine (ZYRTEC) 10 MG tablet Take 10 mg by mouth daily as needed. Reported on 08/02/2015   Yes Historical Provider, MD  FLUoxetine (PROZAC) 20 MG tablet Take 20  mg by mouth daily.   Yes Historical Provider, MD  gabapentin (NEURONTIN) 300 MG capsule Limit 1 tablet by mouth per day or twice a day if tolerated Patient taking differently: Take 300 mg by mouth 2 (two) times daily. Limit 1 tablet by mouth per day or twice a day if tolerated 12/02/15  Yes Mohammed Kindle, MD  HYDROcodone-acetaminophen (NORCO/VICODIN) 5-325 MG tablet Limit one tab po bid if tolerated. Patient taking differently: Take 1 tablet by mouth 2 (two) times daily. Limit one tab po bid if tolerated. 12/02/15  Yes Mohammed Kindle, MD  lisinopril-hydrochlorothiazide (PRINZIDE,ZESTORETIC) 20-25 MG per tablet Take 1 tablet by mouth daily.   Yes Historical Provider, MD  orphenadrine (NORFLEX) 100 MG tablet Limit 1 tablet by mouth per day or twice per day if tolerated 12/02/15  Yes Mohammed Kindle, MD  RABEprazole (ACIPHEX) 20 MG tablet Take 20 mg by mouth daily.   Yes Historical Provider, MD  simvastatin (ZOCOR) 40 MG tablet Take 20 mg by mouth daily.   Yes Historical Provider, MD  topiramate (TOPAMAX) 25 MG tablet Take 25 mg by mouth 2 (two) times daily as needed.    Yes Historical Provider, MD    Family History  Problem Relation Age of Onset  . Heart disease Mother   . Diabetes Mother   .  Kidney disease Mother   . Heart disease Father   . Muscular dystrophy Father      Social History  Substance Use Topics  . Smoking status: Heavy Tobacco Smoker    Packs/day: 0.50  . Smokeless tobacco: Never Used  . Alcohol use No    Allergies as of 03/07/2016 - Review Complete 03/07/2016  Allergen Reaction Noted  . No known allergies  07/30/2014    Review of Systems:    All systems reviewed and negative except where noted in HPI.   Physical Exam:  Vital signs in last 24 hours: Temp:  [98.5 F (36.9 C)] 98.5 F (36.9 C) (12/05 0708) Pulse Rate:  [57-78] 57 (12/05 1335) Resp:  [14-18] 14 (12/05 1335) BP: (110-122)/(64-69) 115/68 (12/05 1335) SpO2:  [93 %-100 %] 93 % (12/05 1335) Weight:  [165  lb (74.8 kg)] 165 lb (74.8 kg) (12/05 0709)   General:   Pleasant, cooperative in NAD Head:  Normocephalic and atraumatic. Eyes:   No icterus.   Conjunctiva pink. PERRLA. Ears:  Normal auditory acuity. Neck:  Supple; no masses or thyroidomegaly Lungs: Respirations even and unlabored. Lungs clear to auscultation bilaterally.   No wheezes, crackles, or rhonchi.  Heart:  Regular rate and rhythm;  Without murmur, clicks, rubs or gallops Abdomen:  Soft, nondistended, mild lower and central abdominal tenderness. Normal bowel sounds. No appreciable masses or hepatomegaly.  No rebound or guarding.  Rectal:  Not performed. Psych:  Alert and cooperative. Normal affect.  LAB RESULTS:  Recent Labs  03/07/16 0714  WBC 9.1  HGB 16.5*  HCT 48.2*  PLT 162   BMET  Recent Labs  03/07/16 0714  NA 137  K 2.5*  CL 100*  CO2 27  GLUCOSE 141*  BUN 18  CREATININE 0.99  CALCIUM 9.6   LFT  Recent Labs  03/07/16 0714  PROT 7.0  ALBUMIN 3.7  AST 78*  ALT 35  ALKPHOS 89  BILITOT 4.0*   PT/INR No results for input(s): LABPROT, INR in the last 72 hours.  STUDIES: Ct Abdomen Pelvis W Contrast  Result Date: 03/07/2016 CLINICAL DATA:  Abdomen pain for 6 weeks. 50 pound weight loss in 10 weeks. EXAM: CT ABDOMEN AND PELVIS WITH CONTRAST TECHNIQUE: Multidetector CT imaging of the abdomen and pelvis was performed using the standard protocol following bolus administration of intravenous contrast. CONTRAST:  152mL ISOVUE-300 IOPAMIDOL (ISOVUE-300) INJECTION 61% COMPARISON:  MRI of the abdomen February 04, 2016 FINDINGS: Lower chest: There is minimal dependent atelectasis of the posterior lung bases. No focal pneumonia is identified. The heart size is normal. Hepatobiliary: There is a low-density lesion in the lateral posterior segment right lobe liver unchanged compared to prior MRI where it was described as a hemangioma. There is mild intrahepatic biliary ductal dilatation. The common bile duct  measures 10 mm. The gallbladder demonstrates a thickened wall with surrounding fluid. Pancreas: There is patchy low density with stranding surrounding the pancreatic head. Spleen: Normal in size without focal abnormality. Adrenals/Urinary Tract: Low-density masses are identified in bilateral adrenal glands consistent with previous MRI noted bilateral adrenal adenomas. Left kidney cysts is identified unchanged. The right kidney is normal. Stomach/Bowel: Stomach is within normal limits. Appendix appears normal. No evidence of bowel wall thickening, distention, or inflammatory changes. Vascular/Lymphatic: Aortic atherosclerosis. No enlarged abdominal or pelvic lymph nodes. Reproductive: Status post hysterectomy. No adnexal masses. Other: There is small umbilical herniation of mesenteric fat. Musculoskeletal: Degenerative joint changes of the spine are noted. IMPRESSION: Low-density identified in  the pancreatic head with surrounding stranding ; the findings could be due to pancreatitis but low-density/partial cystic pancreatic mass is not excluded. There is gallbladder wall thickening with pericholecystic fluid suspicious for cholecystitis. Intra and extrahepatic biliary ductal dilatation identified. Low-density lesion in the lateral posterior segment right lobe liver unchanged compared to prior MRI where it was described as a hemangioma. Low-density masses in bilateral adrenal glands unchanged compared to previous recent MRI where the lesions were described as bilateral adrenal adenomas. Electronically Signed   By: Abelardo Diesel M.D.   On: 03/07/2016 11:32   US Abdomen Limited Ruq  Result Date: 03/07/2016 CLINICAL DATA:  Pancreatitis ; jaundice; abdominal pain. EXAM: US ABDOMEN LIMITED - RIGHT UPPER QUADRANT COMPARISON:  03/07/2016 CT scan FINDINGS: Gallbladder: Gallbladder wall thickening at 5 mm, with trace pericholecystic fluid. Sludge in the gallbladder, without well-defined stones. Sonographic Murphy sign  indeterminate due to pain medication. Common bile duct: Diameter: 9 mm Liver: Subtle intrahepatic biliary dilatation. A dominant 3.8 by 3.6 by 2.1 cm lesion peripherally in the right hepatic lobe is hyperechoic and compatible with the hemangioma shown previously at this location. The smaller hemangioma in the caudate lobe was not well seen on today's ultrasound. IMPRESSION: 1. Mild intrahepatic biliary dilatation with the dilated CBD at 9 mm, this is abnormally dilated, and could be the result of obstruction in the vicinity of the pancreatic head. Appearance on prior CT could possibly reflect a mass in the pancreatic head and adjacent body, versus focal pancreatitis. Even assuming the patient has signs and symptoms of focal pancreatitis, careful follow up to exclude pancreatic malignancy will be important. 2. There is gallbladder wall thickening at 5 mm with trace pericholecystic fluid, potentially from inflammation, correlate clinically in assessing for acute cholecystitis. Sludge noted in the gallbladder. Electronically Signed   By: Van Clines M.D.   On: 03/07/2016 12:46      Impression / Plan:   ANNAJEAN PATRUNO is a 55 y.o. y/o female with abdominal pain for a few weeks duration. MRI abdomen on 02/04/2016 showed a normal pancreas, no gall stones and normal gall bladder. Today she has an elevated bilirubin , CBD 71mm and CT scan evidence of pancreatic stranding(acute pancreatitis) +/- mass in her pancreas. It is possible that she has developed gall stone pancreatitis or a growth in her pancreas is leading up to biliary blockage and pancreatitis. It is interesting that no abnormality was seen on his pancreas a few weeks back.    Plan  1. Continue IV hydration  2. Surgical consult for cholecystectomy  3. Discussed with Dr Allen Norris regarding ERCP-  unable to do ERCP now since potassium is low. Replace potassium, cover with IV antibiotics for possible cholecystitis- will plan for ERCP possibly tomorrow or  day after 4. Would benefit with EUS or MRCP in a few weeks to evaluate the pancreas once the inflammation has subsided.   Thank you for involving me in the care of this patient.      LOS: 0 days   Jonathon Bellows, MD  03/07/2016, 2:35 PM

## 2016-03-07 NOTE — ED Notes (Signed)
Patient transported to Ultrasound 

## 2016-03-07 NOTE — ED Provider Notes (Signed)
[facility] Emergency Department Provider Note ____________________________________________   I have reviewed the triage vital signs and the triage nursing note.  HISTORY  Chief Complaint Abdominal Pain   Historian Patient and spouse  HPI Michelle Conway is a 55 y.o. female presents today due to abdominal pain which is mid and lower. She states that she's been having belly pain for several weeks now. She states she's also been having some nausea. The patient diarrhea. She's had some weight loss. She's been evaluated by her primary doctor here recently with upper abdomen ultrasound, and an MRI that showed some spots on her liver.  She is supposedly being referred to see a surgeon for this. She came in today because the abdominal pain is worse. Pain is crampy and intermittent.  Nothing seems to make it worse or better.   Past Medical History:  Diagnosis Date  . Allergy   . Arthritis   . Bronchial pneumonia 11/2015   patient currently being treated  . Hyperlipidemia   . Hypertension     Patient Active Problem List   Diagnosis Date Noted  . Total knee replacement status 09/06/2014  . CRPS (complex regional pain syndrome) type I 08/03/2014  . Lumbar back pain with radiculopathy affecting left lower extremity 08/03/2014  . Lumbar back pain with radiculopathy affecting right lower extremity 08/03/2014  . Low back pain potentially associated with spinal stenosis 08/03/2014    Past Surgical History:  Procedure Laterality Date  . ABDOMINAL HYSTERECTOMY    . JOINT REPLACEMENT Bilateral    knees  . KNEE SURGERY Right   . KNEE SURGERY Left   . TONSILLECTOMY Bilateral   . TONSILLECTOMY AND ADENOIDECTOMY N/A     Prior to Admission medications   Medication Sig Start Date End Date Taking? Authorizing Provider  benzonatate (TESSALON) 100 MG capsule Take by mouth 3 (three) times daily.   Yes Historical Provider, MD  cetirizine (ZYRTEC) 10 MG tablet Take 10 mg  by mouth daily as needed. Reported on 08/02/2015   Yes Historical Provider, MD  FLUoxetine (PROZAC) 20 MG tablet Take 20 mg by mouth daily.   Yes Historical Provider, MD  gabapentin (NEURONTIN) 300 MG capsule Limit 1 tablet by mouth per day or twice a day if tolerated Patient taking differently: Take 300 mg by mouth 2 (two) times daily. Limit 1 tablet by mouth per day or twice a day if tolerated 12/02/15  Yes Mohammed Kindle, MD  HYDROcodone-acetaminophen (NORCO/VICODIN) 5-325 MG tablet Limit one tab po bid if tolerated. Patient taking differently: Take 1 tablet by mouth 2 (two) times daily. Limit one tab po bid if tolerated. 12/02/15  Yes Mohammed Kindle, MD  lisinopril-hydrochlorothiazide (PRINZIDE,ZESTORETIC) 20-25 MG per tablet Take 1 tablet by mouth daily.   Yes Historical Provider, MD  orphenadrine (NORFLEX) 100 MG tablet Limit 1 tablet by mouth per day or twice per day if tolerated 12/02/15  Yes Mohammed Kindle, MD  RABEprazole (ACIPHEX) 20 MG tablet Take 20 mg by mouth daily.   Yes Historical Provider, MD  simvastatin (ZOCOR) 40 MG tablet Take 20 mg by mouth daily.   Yes Historical Provider, MD  topiramate (TOPAMAX) 25 MG tablet Take 25 mg by mouth 2 (two) times daily as needed.    Yes Historical Provider, MD    Allergies  Allergen Reactions  . No Known Allergies     Family History  Problem Relation Age of Onset  . Heart disease Mother   . Diabetes Mother   .  Kidney disease Mother   . Heart disease Father   . Muscular dystrophy Father     Social History Social History  Substance Use Topics  . Smoking status: Heavy Tobacco Smoker    Packs/day: 0.50  . Smokeless tobacco: Never Used  . Alcohol use No    Review of Systems  Constitutional: Negative for fever. Eyes: Negative for visual changes. ENT: Negative for sore throat. Cardiovascular: Negative for chest pain. Respiratory: Negative for shortness of breath. Gastrointestinal: Negative for black or bloody stool. Genitourinary:  Negative for dysuria. Musculoskeletal: Negative for back pain. Skin: Negative for rash. Neurological: Negative for headache. 10 point Review of Systems otherwise negative ____________________________________________   PHYSICAL EXAM:  VITAL SIGNS: ED Triage Vitals  Enc Vitals Group     BP 03/07/16 0708 116/69     Pulse Rate 03/07/16 0708 78     Resp 03/07/16 0708 18     Temp 03/07/16 0708 98.5 F (36.9 C)     Temp Source 03/07/16 0708 Oral     SpO2 03/07/16 0708 100 %     Weight 03/07/16 0709 165 lb (74.8 kg)     Height 03/07/16 0709 5\' 3"  (1.6 m)     Head Circumference --      Peak Flow --      Pain Score 03/07/16 0709 8     Pain Loc --      Pain Edu? --      Excl. in [place]? --      Constitutional: Alert and oriented. Well appearing and in no distress. HEENT   Head: Normocephalic and atraumatic.      Eyes: Conjunctivae are normal. PERRL. Normal extraocular movements.      Ears:         Nose: No congestion/rhinnorhea.   Mouth/Throat: Mucous membranes are Mildly dry.   Neck: No stridor. Cardiovascular/Chest: Normal rate, regular rhythm.  No murmurs, rubs, or gallops. Respiratory: Normal respiratory effort without tachypnea nor retractions. Breath sounds are clear and equal bilaterally. No wheezes/rales/rhonchi. Gastrointestinal: Soft. No distention, no guarding, no rebound. Moderate tenderness diffusely. More so in the lower left abdomen.  Genitourinary/rectal:Deferred Musculoskeletal: Nontender with normal range of motion in all extremities. No joint effusions.  No lower extremity tenderness.  No edema. Neurologic:  Normal speech and language. No gross or focal neurologic deficits are appreciated. Skin:  Skin is warm, dry and intact. No rash noted. Psychiatric: Mood and affect are normal. Speech and behavior are normal. Patient exhibits appropriate insight and judgment.   ____________________________________________  LABS (pertinent positives/negatives)  Labs  Reviewed  LIPASE, BLOOD - Abnormal; Notable for the following:       Result Value   Lipase 86 (*)    All other components within normal limits  COMPREHENSIVE METABOLIC PANEL - Abnormal; Notable for the following:    Potassium 2.5 (*)    Chloride 100 (*)    Glucose, Bld 141 (*)    AST 78 (*)    Total Bilirubin 4.0 (*)    All other components within normal limits  CBC - Abnormal; Notable for the following:    Hemoglobin 16.5 (*)    HCT 48.2 (*)    All other components within normal limits  URINALYSIS COMPLETEWITH MICROSCOPIC ([facility] ONLY) - Abnormal; Notable for the following:    Color, Urine AMBER (*)    APPearance CLEAR (*)    Hgb urine dipstick 3+ (*)    Bacteria, UA RARE (*)    Squamous Epithelial / LPF 0-5 (*)  All other components within normal limits  URINE CULTURE    ____________________________________________    EKG I, Lisa Roca, MD, the attending physician have personally viewed and interpreted all ECGs.  64 bpm. Normal sinus rhythm. Narrow QRS. Normal axis.  nonspecific ST-T wave ____________________________________________  RADIOLOGY All Xrays were viewed by me. Imaging interpreted by Radiologist.  CT abdomen and pelvis with contrast:  IMPRESSION: Low-density identified in the pancreatic head with surrounding stranding ; the findings could be due to pancreatitis but low-density/partial cystic pancreatic mass is not excluded. There is gallbladder wall thickening with pericholecystic fluid suspicious for cholecystitis. Intra and extrahepatic biliary ductal dilatation identified.  Low-density lesion in the lateral posterior segment right lobe liver unchanged compared to prior MRI where it was described as a hemangioma.  Low-density masses in bilateral adrenal glands unchanged compared to previous recent MRI where the lesions were described as bilateral adrenal adenomas.  [place] ruq:  IMPRESSION: 1. Mild intrahepatic biliary dilatation with the dilated  CBD at 9 mm, this is abnormally dilated, and could be the result of obstruction in the vicinity of the pancreatic head. Appearance on prior CT could possibly reflect a mass in the pancreatic head and adjacent body, versus focal pancreatitis. Even assuming the patient has signs and symptoms of focal pancreatitis, careful follow up to exclude pancreatic malignancy will be important. 2. There is gallbladder wall thickening at 5 mm with trace pericholecystic fluid, potentially from inflammation, correlate clinically in assessing for acute cholecystitis. Sludge noted in the gallbladder. __________________________________________  PROCEDURES  Procedure(s) performed: None  Critical Care performed: None  ____________________________________________   ED COURSE / ASSESSMENT AND PLAN  Pertinent labs & imaging results that were available during my care of the patient were reviewed by me and considered in my medical decision making (see chart for details).   Ms. Ramone is here for abdominal pain that sounds like it's been ongoing but worsening over the past couple weeks. Laboratory studies show hypokalemia. She does report being on a diuretic. She has had decreased by mouth intake but no frank fluid losses from vomiting and diarrhea.  I reviewed her recent abdomen ultrasound and MRI the abdomen, but this imaging does not cover the entire area where she is hurting which is I think even more focal in the left lower quadrant. I did go ahead and image this area. She was started on by mouth and IV potassium repletion.  CT showing similar massess in the liver and adrenal glands from the MRI, also showing possible pancreatitis vs mass and some findings concerning for cholecystitis.  I will add ruq u/s.  On exam, clinically not ruq focal tenderness or fevers, feel acute cholecystitis less consistent with clinical picture.  I spoke with Dr. Dahlia Byes, general surgery, who will consult, but is less  suspicious for patient needing emergency surgery, more likely will need ERCP with elevated bilirubin. Possibly for gallstone pancreatitis versus obstruction due to possible pancreatic head mass. In any case, any additional recommendations regarding the cholecystitis we will wait on Dr.Pabon's recommendations.   Will be admitted to hospitalist for treatment of intractable pain, gallstone pancreatitis vs. Obstructive pancreatic mass, also with low potassium with fatigue.   CONSULTATIONS:   Hospitalist for admission.  General Surgery for consultation.  Patient / Family / Caregiver informed of clinical course, medical decision-making process, and agree with plan.  ___________________________________________   FINAL CLINICAL IMPRESSION(S) / ED DIAGNOSES   Final diagnoses:  Pancreatitis  Hypokalemia  Lower abdominal pain  Biliary sludge  Note: This dictation was prepared with Dragon dictation. Any transcriptional errors that result from this process are unintentional    Lisa Roca, MD 03/07/16 1345

## 2016-03-08 ENCOUNTER — Inpatient Hospital Stay: Payer: Managed Care, Other (non HMO)

## 2016-03-08 ENCOUNTER — Ambulatory Visit: Payer: Self-pay | Admitting: Gastroenterology

## 2016-03-08 ENCOUNTER — Other Ambulatory Visit: Payer: Self-pay

## 2016-03-08 DIAGNOSIS — K858 Other acute pancreatitis without necrosis or infection: Secondary | ICD-10-CM

## 2016-03-08 DIAGNOSIS — E43 Unspecified severe protein-calorie malnutrition: Secondary | ICD-10-CM | POA: Insufficient documentation

## 2016-03-08 DIAGNOSIS — R1011 Right upper quadrant pain: Secondary | ICD-10-CM

## 2016-03-08 LAB — COMPREHENSIVE METABOLIC PANEL
ALK PHOS: 96 U/L (ref 38–126)
ALT: 53 U/L (ref 14–54)
ANION GAP: 6 (ref 5–15)
AST: 111 U/L — ABNORMAL HIGH (ref 15–41)
Albumin: 2.8 g/dL — ABNORMAL LOW (ref 3.5–5.0)
BILIRUBIN TOTAL: 5.5 mg/dL — AB (ref 0.3–1.2)
BUN: 9 mg/dL (ref 6–20)
CO2: 27 mmol/L (ref 22–32)
Calcium: 8.8 mg/dL — ABNORMAL LOW (ref 8.9–10.3)
Chloride: 108 mmol/L (ref 101–111)
Creatinine, Ser: 0.39 mg/dL — ABNORMAL LOW (ref 0.44–1.00)
Glucose, Bld: 94 mg/dL (ref 65–99)
POTASSIUM: 4.2 mmol/L (ref 3.5–5.1)
Sodium: 141 mmol/L (ref 135–145)
Total Protein: 5.7 g/dL — ABNORMAL LOW (ref 6.5–8.1)

## 2016-03-08 LAB — CBC
HEMATOCRIT: 41.9 % (ref 35.0–47.0)
HEMOGLOBIN: 14.1 g/dL (ref 12.0–16.0)
MCH: 32.2 pg (ref 26.0–34.0)
MCHC: 33.7 g/dL (ref 32.0–36.0)
MCV: 95.7 fL (ref 80.0–100.0)
Platelets: 118 10*3/uL — ABNORMAL LOW (ref 150–440)
RBC: 4.38 MIL/uL (ref 3.80–5.20)
RDW: 13.4 % (ref 11.5–14.5)
WBC: 4.7 10*3/uL (ref 3.6–11.0)

## 2016-03-08 LAB — PROTIME-INR
INR: 1.19
Prothrombin Time: 15.2 seconds (ref 11.4–15.2)

## 2016-03-08 LAB — LIPASE, BLOOD: Lipase: 55 U/L — ABNORMAL HIGH (ref 11–51)

## 2016-03-08 LAB — CEA: CEA: 3.4 ng/mL (ref 0.0–4.7)

## 2016-03-08 LAB — BILIRUBIN, FRACTIONATED(TOT/DIR/INDIR)
BILIRUBIN TOTAL: 5.1 mg/dL — AB (ref 0.3–1.2)
Bilirubin, Direct: 3.2 mg/dL — ABNORMAL HIGH (ref 0.1–0.5)
Indirect Bilirubin: 1.9 mg/dL — ABNORMAL HIGH (ref 0.3–0.9)

## 2016-03-08 LAB — APTT: APTT: 38 s — AB (ref 24–36)

## 2016-03-08 LAB — CA 19-9 (SERIAL): CA 19 9: 223 U/mL — AB (ref 0–35)

## 2016-03-08 LAB — URINE CULTURE

## 2016-03-08 LAB — MAGNESIUM: MAGNESIUM: 1.8 mg/dL (ref 1.7–2.4)

## 2016-03-08 MED ORDER — TECHNETIUM TC 99M MEBROFENIN IV KIT
7.4200 | PACK | Freq: Once | INTRAVENOUS | Status: AC | PRN
  Administered 2016-03-08: 7.42 via INTRAVENOUS

## 2016-03-08 MED ORDER — BOOST / RESOURCE BREEZE PO LIQD
1.0000 | Freq: Three times a day (TID) | ORAL | Status: DC
  Administered 2016-03-08 – 2016-03-09 (×2): 1 via ORAL

## 2016-03-08 MED ORDER — GADOBENATE DIMEGLUMINE 529 MG/ML IV SOLN
15.0000 mL | Freq: Once | INTRAVENOUS | Status: AC | PRN
  Administered 2016-03-08: 15 mL via INTRAVENOUS

## 2016-03-08 MED ORDER — OXYCODONE-ACETAMINOPHEN 5-325 MG PO TABS
1.0000 | ORAL_TABLET | Freq: Four times a day (QID) | ORAL | Status: DC | PRN
  Administered 2016-03-08 – 2016-03-11 (×8): 1 via ORAL
  Filled 2016-03-08 (×8): qty 1

## 2016-03-08 NOTE — Progress Notes (Signed)
CC: GS pancreatitis vs pancreatic mass  Subjective: HEr pain has improved some, persistent jaundice No fevers or chills  Objective: Vital signs in last 24 hours: Temp:  [97.7 F (36.5 C)-98.4 F (36.9 C)] 98.4 F (36.9 C) (12/06 1343) Pulse Rate:  [54-75] 75 (12/06 1343) Resp:  [14-24] 18 (12/06 1343) BP: (113-143)/(62-79) 143/76 (12/06 1343) SpO2:  [95 %-99 %] 99 % (12/06 1343) Weight:  [78.1 kg (172 lb 1.6 oz)] 78.1 kg (172 lb 1.6 oz) (12/05 1605) Last BM Date: 03/08/16  Intake/Output from previous day: 12/05 0701 - 12/06 0700 In: 1192 [I.V.:1192] Out: 525 [Urine:525] Intake/Output this shift: Total I/O In: -  Out: 200 [Urine:200]  Physical exam: NAD , debilitated, jaundice Abd: soft, diffuse mild tenderness , no peirotonitis, no murphy Ext: well perfused, mild edema  Lab Results: CBC   Recent Labs  03/07/16 0714 03/08/16 0452  WBC 9.1 4.7  HGB 16.5* 14.1  HCT 48.2* 41.9  PLT 162 118*   BMET  Recent Labs  03/07/16 0714 03/08/16 0452  NA 137 141  K 2.5* 4.2  CL 100* 108  CO2 27 27  GLUCOSE 141* 94  BUN 18 9  CREATININE 0.99 0.39*  CALCIUM 9.6 8.8*   PT/INR  Recent Labs  03/08/16 0452  LABPROT 15.2  INR 1.19   ABG No results for input(s): PHART, HCO3 in the last 72 hours.  Invalid input(s): PCO2, PO2  Studies/Results: Nm Hepatobiliary Liver Func  Result Date: 03/08/2016 CLINICAL DATA:  Evaluate for cholecystitis. EXAM: NUCLEAR MEDICINE HEPATOBILIARY IMAGING TECHNIQUE: Sequential images of the abdomen were obtained out to 60 minutes following intravenous administration of radiopharmaceutical. RADIOPHARMACEUTICALS:  7.4 mCi Tc-56m  Choletec IV COMPARISON:  None. FINDINGS: Prompt uptake and biliary excretion of activity by the liver is seen. No gallbladder activity is visualized. No bile activity within the small bowel, consistent with obstruction of the common bile duct versus hepatocyte function. IMPRESSION: 1. Imaging findings are compatible  with a high-grade common bile duct obstruction and correspond with findings of an obstructing lesion arising from the pancreas as seen on MRI from today. Electronically Signed   By: Kerby Moors M.D.   On: 03/08/2016 14:24   Ct Abdomen Pelvis W Contrast  Result Date: 03/07/2016 CLINICAL DATA:  Abdomen pain for 6 weeks. 50 pound weight loss in 10 weeks. EXAM: CT ABDOMEN AND PELVIS WITH CONTRAST TECHNIQUE: Multidetector CT imaging of the abdomen and pelvis was performed using the standard protocol following bolus administration of intravenous contrast. CONTRAST:  129mL ISOVUE-300 IOPAMIDOL (ISOVUE-300) INJECTION 61% COMPARISON:  MRI of the abdomen February 04, 2016 FINDINGS: Lower chest: There is minimal dependent atelectasis of the posterior lung bases. No focal pneumonia is identified. The heart size is normal. Hepatobiliary: There is a low-density lesion in the lateral posterior segment right lobe liver unchanged compared to prior MRI where it was described as a hemangioma. There is mild intrahepatic biliary ductal dilatation. The common bile duct measures 10 mm. The gallbladder demonstrates a thickened wall with surrounding fluid. Pancreas: There is patchy low density with stranding surrounding the pancreatic head. Spleen: Normal in size without focal abnormality. Adrenals/Urinary Tract: Low-density masses are identified in bilateral adrenal glands consistent with previous MRI noted bilateral adrenal adenomas. Left kidney cysts is identified unchanged. The right kidney is normal. Stomach/Bowel: Stomach is within normal limits. Appendix appears normal. No evidence of bowel wall thickening, distention, or inflammatory changes. Vascular/Lymphatic: Aortic atherosclerosis. No enlarged abdominal or pelvic lymph nodes. Reproductive: Status post hysterectomy. No  adnexal masses. Other: There is small umbilical herniation of mesenteric fat. Musculoskeletal: Degenerative joint changes of the spine are noted. IMPRESSION:  Low-density identified in the pancreatic head with surrounding stranding ; the findings could be due to pancreatitis but low-density/partial cystic pancreatic mass is not excluded. There is gallbladder wall thickening with pericholecystic fluid suspicious for cholecystitis. Intra and extrahepatic biliary ductal dilatation identified. Low-density lesion in the lateral posterior segment right lobe liver unchanged compared to prior MRI where it was described as a hemangioma. Low-density masses in bilateral adrenal glands unchanged compared to previous recent MRI where the lesions were described as bilateral adrenal adenomas. Electronically Signed   By: Abelardo Diesel M.D.   On: 03/07/2016 11:32   US Abdomen Limited Ruq  Result Date: 03/07/2016 CLINICAL DATA:  Pancreatitis ; jaundice; abdominal pain. EXAM: US ABDOMEN LIMITED - RIGHT UPPER QUADRANT COMPARISON:  03/07/2016 CT scan FINDINGS: Gallbladder: Gallbladder wall thickening at 5 mm, with trace pericholecystic fluid. Sludge in the gallbladder, without well-defined stones. Sonographic Murphy sign indeterminate due to pain medication. Common bile duct: Diameter: 9 mm Liver: Subtle intrahepatic biliary dilatation. A dominant 3.8 by 3.6 by 2.1 cm lesion peripherally in the right hepatic lobe is hyperechoic and compatible with the hemangioma shown previously at this location. The smaller hemangioma in the caudate lobe was not well seen on today's ultrasound. IMPRESSION: 1. Mild intrahepatic biliary dilatation with the dilated CBD at 9 mm, this is abnormally dilated, and could be the result of obstruction in the vicinity of the pancreatic head. Appearance on prior CT could possibly reflect a mass in the pancreatic head and adjacent body, versus focal pancreatitis. Even assuming the patient has signs and symptoms of focal pancreatitis, careful follow up to exclude pancreatic malignancy will be important. 2. There is gallbladder wall thickening at 5 mm with trace  pericholecystic fluid, potentially from inflammation, correlate clinically in assessing for acute cholecystitis. Sludge noted in the gallbladder. Electronically Signed   By: Van Clines M.D.   On: 03/07/2016 12:46    Anti-infectives: Anti-infectives    None      Assessment/Plan: Pancreatitis vs mass head pancreas causing biliary obstruction d/w Dr. Vicente Males and MRCP beneficial to deliniate the anatomy outside CBD , extrinsic compression? No need for surgical intervention at this time , is pancreatic mass , she may need to be xfer to tertiary center, still may need ERCP to relieve biliary obstruction. D/W pt in detail Caroleen Hamman, MD, [facility]  03/08/2016

## 2016-03-08 NOTE — Progress Notes (Addendum)
[place] [facility] [place] NAME: Michelle Conway    MR#:  [id number]  DATE OF BIRTH:  05-19-1960  SUBJECTIVE:  Came in with increasing abd pain and weight loss  REVIEW OF SYSTEMS:   Review of Systems  Constitutional: Positive for weight loss. Negative for chills and fever.  HENT: Negative for ear discharge, ear pain and nosebleeds.   Eyes: Negative for blurred vision, pain and discharge.  Respiratory: Negative for sputum production, shortness of breath, wheezing and stridor.   Cardiovascular: Negative for chest pain, palpitations, orthopnea and PND.  Gastrointestinal: Positive for abdominal pain. Negative for diarrhea, nausea and vomiting.  Genitourinary: Negative for frequency and urgency.  Musculoskeletal: Negative for back pain and joint pain.  Neurological: Positive for weakness. Negative for sensory change, speech change and focal weakness.  Psychiatric/Behavioral: Negative for depression and hallucinations. The patient is not nervous/anxious.    Tolerating Diet:npo Tolerating PT: not needed  DRUG ALLERGIES:   Allergies  Allergen Reactions  . No Known Allergies     VITALS:  Blood pressure (!) 143/76, pulse 75, temperature 98.4 F (36.9 C), temperature source Oral, resp. rate 18, height 5\' 3"  (1.6 m), weight 78.1 kg (172 lb 1.6 oz), SpO2 99 %.  PHYSICAL EXAMINATION:   Physical Exam  GENERAL:  55 y.o.-year-old patient lying in the bed with no acute distress. Appears malnourished EYES: Pupils equal, round, reactive to light and accommodation.++ scleral icterus. Extraocular muscles intact.  HEENT: Head atraumatic, normocephalic. Oropharynx and nasopharynx clear.  NECK:  Supple, no jugular venous distention. No thyroid enlargement, no tenderness.  LUNGS: Normal breath sounds bilaterally, no wheezing, rales, rhonchi. No use of accessory muscles of respiration.  CARDIOVASCULAR: S1, S2 normal. No murmurs, rubs, or gallops.   ABDOMEN: Soft, nontender, nondistended. Bowel sounds present. No organomegaly or mass.  EXTREMITIES: No cyanosis, clubbing or edema b/l.    NEUROLOGIC: Cranial nerves II through XII are intact. No focal Motor or sensory deficits b/l.   PSYCHIATRIC:  patient is alert and oriented x 3.  SKIN: No obvious rash, lesion, or ulcer.   LABORATORY PANEL:  CBC  Recent Labs Lab 03/08/16 0452  WBC 4.7  HGB 14.1  HCT 41.9  PLT 118*    Chemistries   Recent Labs Lab 03/08/16 0452  NA 141  K 4.2  CL 108  CO2 27  GLUCOSE 94  BUN 9  CREATININE 0.39*  CALCIUM 8.8*  MG 1.8  AST 111*  ALT 53  ALKPHOS 96  BILITOT 5.1*  5.5*   Cardiac Enzymes No results for input(s): TROPONINI in the last 168 hours. RADIOLOGY:  Nm Hepatobiliary Liver Func  Result Date: 03/08/2016 CLINICAL DATA:  Evaluate for cholecystitis. EXAM: NUCLEAR MEDICINE HEPATOBILIARY IMAGING TECHNIQUE: Sequential images of the abdomen were obtained out to 60 minutes following intravenous administration of radiopharmaceutical. RADIOPHARMACEUTICALS:  7.4 mCi Tc-93m  Choletec IV COMPARISON:  None. FINDINGS: Prompt uptake and biliary excretion of activity by the liver is seen. No gallbladder activity is visualized. No bile activity within the small bowel, consistent with obstruction of the common bile duct versus hepatocyte function. IMPRESSION: 1. Imaging findings are compatible with a high-grade common bile duct obstruction and correspond with findings of an obstructing lesion arising from the pancreas as seen on MRI from today. Electronically Signed   By: Kerby Moors M.D.   On: 03/08/2016 14:24   Ct Abdomen Pelvis W Contrast  Result Date: 03/07/2016 CLINICAL DATA:  Abdomen pain for 6 weeks.  50 pound weight loss in 10 weeks. EXAM: CT ABDOMEN AND PELVIS WITH CONTRAST TECHNIQUE: Multidetector CT imaging of the abdomen and pelvis was performed using the standard protocol following bolus administration of intravenous contrast.  CONTRAST:  130mL ISOVUE-300 IOPAMIDOL (ISOVUE-300) INJECTION 61% COMPARISON:  MRI of the abdomen February 04, 2016 FINDINGS: Lower chest: There is minimal dependent atelectasis of the posterior lung bases. No focal pneumonia is identified. The heart size is normal. Hepatobiliary: There is a low-density lesion in the lateral posterior segment right lobe liver unchanged compared to prior MRI where it was described as a hemangioma. There is mild intrahepatic biliary ductal dilatation. The common bile duct measures 10 mm. The gallbladder demonstrates a thickened wall with surrounding fluid. Pancreas: There is patchy low density with stranding surrounding the pancreatic head. Spleen: Normal in size without focal abnormality. Adrenals/Urinary Tract: Low-density masses are identified in bilateral adrenal glands consistent with previous MRI noted bilateral adrenal adenomas. Left kidney cysts is identified unchanged. The right kidney is normal. Stomach/Bowel: Stomach is within normal limits. Appendix appears normal. No evidence of bowel wall thickening, distention, or inflammatory changes. Vascular/Lymphatic: Aortic atherosclerosis. No enlarged abdominal or pelvic lymph nodes. Reproductive: Status post hysterectomy. No adnexal masses. Other: There is small umbilical herniation of mesenteric fat. Musculoskeletal: Degenerative joint changes of the spine are noted. IMPRESSION: Low-density identified in the pancreatic head with surrounding stranding ; the findings could be due to pancreatitis but low-density/partial cystic pancreatic mass is not excluded. There is gallbladder wall thickening with pericholecystic fluid suspicious for cholecystitis. Intra and extrahepatic biliary ductal dilatation identified. Low-density lesion in the lateral posterior segment right lobe liver unchanged compared to prior MRI where it was described as a hemangioma. Low-density masses in bilateral adrenal glands unchanged compared to previous recent  MRI where the lesions were described as bilateral adrenal adenomas. Electronically Signed   By: Abelardo Diesel M.D.   On: 03/07/2016 11:32   Mr Abdomen Mrcp Moise Boring Contast  Result Date: 03/08/2016 CLINICAL DATA:  55 year old female admitted with worsening abdominal pain, mildly elevated lipase, elevated serum bilirubin levels and new biliary ductal dilatation and ill-defined low-attenuation throughout the pancreatic head on CT and ultrasound. EXAM: MRI ABDOMEN WITHOUT AND WITH CONTRAST (INCLUDING MRCP) TECHNIQUE: Multiplanar multisequence MR imaging of the abdomen was performed both before and after the administration of intravenous contrast. Heavily T2-weighted images of the biliary and pancreatic ducts were obtained, and three-dimensional MRCP images were rendered by post processing. CONTRAST:  26mL MULTIHANCE GADOBENATE DIMEGLUMINE 529 MG/ML IV SOLN COMPARISON:  03/07/2016 right upper quadrant ultrasound and CT abdomen/ pelvis. 02/04/2016 MRI abdomen. FINDINGS: Motion degraded scan. Lower chest: Clear lung bases. Hepatobiliary: Normal liver size and configuration. No hepatic steatosis. There are three similar-appearing T2 hyperintense liver masses each demonstrating progressive discontinuous peripheral nodular enhancement consistent with benign hemangiomas, measuring 4.1 cm in the subcapsular inferior right liver lobe (series 18/ image 33), 0.8 cm in the medial segment 5 right liver lobe (series 18/ image 23) and 0.9 cm in the central segment 8 right liver lobe (series 18/ image 11), all stable in size since 02/04/2016 MRI. There are four additional scattered tiny liver lesions measuring up to the 0.5 cm (series 18/images 7, 9, 11 and 21), too small to accurately characterize, not appreciably changed in size since 02/04/2016. No new liver lesions. Mildly distended gallbladder. Sludge is seen layering in the gallbladder. Moderate diffuse gallbladder wall thickening. No gallstones. Mild diffuse intrahepatic  biliary ductal dilatation. Common bile duct diameter 9 mm,  mildly dilated. There is abrupt cut off of the common bile duct at the level of the pancreatic neck (series 3/ image 13). Pancreas: There is an infiltrative 3.2 x 2.7 x 3.1 cm pancreatic neck mass (series 23/image 7), which demonstrates restricted diffusion, mild T2 hyperintensity and heterogeneous hypoenhancement. There is associated mild dilatation of the main pancreatic duct in the pancreatic body and tail (3 mm diameter). There is atrophy of the pancreatic body and tail. Spleen: Normal size. No mass. Adrenals/Urinary Tract: There are bilateral adrenal adenomas measuring 1.4 cm on the right and 6.4 cm on the left, both demonstrating significant loss of signal intensity on out of phase chemical shift imaging. No hydronephrosis. Simple 7.9 cm upper left renal cyst. No suspicious renal masses. Stomach/Bowel: Grossly normal stomach. Visualized small and large bowel is normal caliber, with no bowel wall thickening. Vascular/Lymphatic: Normal caliber abdominal aorta. Patent hepatic, renal and splenic veins. There is encasement and high-grade narrowing of the portosplenic venous confluence by the pancreatic neck mass (series 22/image 24 and series 16/ image 24). The mass appears to abut the common hepatic artery. The superior mesenteric artery is uninvolved by the mass. Enlarged 1.4 cm portacaval node (series 20/ image 26). No additional pathologically enlarged abdominal lymph nodes. Other: No abdominal ascites or focal fluid collection. Musculoskeletal: No aggressive appearing focal osseous lesions. IMPRESSION: 1. Infiltrative hypoenhancing 3.2 cm pancreatic neck mass with associated abrupt cut off of the common bile duct, mild main pancreatic duct dilation and atrophy of the pancreatic body and tail. 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 Encasement and high-grade narrowing of the portosplenic venous confluence by the mass. 2. Dilated  common bile duct (9 mm diameter). Mild diffuse intrahepatic biliary ductal dilatation. Distended sludge filled thick walled gallbladder. No cholelithiasis. 3. Portacaval lymphadenopathy, suspicious for nodal metastasis. 4. No definite liver metastases. Three liver hemangiomas. Four additional tiny scattered liver lesions measuring up to 0.5 cm, too small to accurately characterize, for which 1 month stability has been demonstrated. Attention is recommended on follow-up MRI abdomen without and with IV contrast in 3-6 months. 5. Bilateral adrenal adenomas. These results were called by telephone at the time of interpretation on 03/08/2016 at 2:31 pm to Dr. Jonathon Bellows , who verbally acknowledged these results. Electronically Signed   By: Ilona Sorrel M.D.   On: 03/08/2016 14:37   US Abdomen Limited Ruq  Result Date: 03/07/2016 CLINICAL DATA:  Pancreatitis ; jaundice; abdominal pain. EXAM: US ABDOMEN LIMITED - RIGHT UPPER QUADRANT COMPARISON:  03/07/2016 CT scan FINDINGS: Gallbladder: Gallbladder wall thickening at 5 mm, with trace pericholecystic fluid. Sludge in the gallbladder, without well-defined stones. Sonographic Murphy sign indeterminate due to pain medication. Common bile duct: Diameter: 9 mm Liver: Subtle intrahepatic biliary dilatation. A dominant 3.8 by 3.6 by 2.1 cm lesion peripherally in the right hepatic lobe is hyperechoic and compatible with the hemangioma shown previously at this location. The smaller hemangioma in the caudate lobe was not well seen on today's ultrasound. IMPRESSION: 1. Mild intrahepatic biliary dilatation with the dilated CBD at 9 mm, this is abnormally dilated, and could be the result of obstruction in the vicinity of the pancreatic head. Appearance on prior CT could possibly reflect a mass in the pancreatic head and adjacent body, versus focal pancreatitis. Even assuming the patient has signs and symptoms of focal pancreatitis, careful follow up to exclude pancreatic malignancy  will be important. 2. There is gallbladder wall thickening at 5 mm with trace pericholecystic fluid, potentially from inflammation,  correlate clinically in assessing for acute cholecystitis. Sludge noted in the gallbladder. Electronically Signed   By: Van Clines M.D.   On: 03/07/2016 12:46   ASSESSMENT AND PLAN:  Michelle Conway  is a 55 y.o. female with a known history of Arthritis, bronchial pneumonia, hyperlipidemia, hypertension- started having generalized abdominal pain for last 2 and half months, pain is constant but fluctuating, associated with nausea and almost 50 pound weight loss in last 2 months She was noted to have elevated bilirubin and lipase level and on CT and abdominal ultrasound she was noted to have possibly pancreatic head mass or obstructed CBD,  * Acute pancreatitis with Obstructive jaundice from mass arising from the pancreas -MRCP and HiDA positive -2 weeks ago MRI abdomen --pancreas was reported negative   IV fluids and keep nothing by mouth.   Symptomatic treatment for pain and nausea. -Dr Vicente Males to do ERCP tomoroow    * Elevated bilirubin, nausea and weight loss   There is a dilated common bile duct on radiological imaging.   Possibility of pancreatic head mass is also suspected.   Appreciate GI and surgical consult      * Hypertension   Blood pressure is stable, monitor.  * Hypokalemia   Replace IV and oral, check magnesium.  * Smoking   Tobacco cessation counseling was done for 4 minutes by me offered nicotine patch.  D/w dr Vicente Males and pt's husband  Case discussed with Care Management/Social Worker. Management plans discussed with the patient, family and they are in agreement.  CODE STATUS: FULL  DVT Prophylaxis: SCD  TOTAL TIME TAKING CARE OF THIS PATIENT: 30 minutes.  >50% time spent on counselling and coordination of care  POSSIBLE D/C IN 2-3 DAYS, DEPENDING ON CLINICAL CONDITION.  Note: This dictation was prepared with Dragon dictation  along with smaller phrase technology. Any transcriptional errors that result from this process are unintentional.  Breckyn Troyer M.D on 03/08/2016 at 8:39 PM  Between 7am to 6pm - Pager - [phone]  After 6pm go to [email or web address] - password [email or web address] [facility]  Office  [phone]  CC: Primary care physician; Cletis Athens, MD

## 2016-03-08 NOTE — Progress Notes (Signed)
Jonathon Bellows MD [address] [place], [place] [address] Phone: [phone] Fax : [phone]  Michelle Conway is being followed for abnormal LFT's, pancreatitis   Day 2 of follow up   Subjective: She feels much better No fever, minimal abdominal pain  No nausea or vomiting  Feels thirsty    Objective: Vital signs in last 24 hours: Vitals:   03/07/16 1607 03/07/16 2047 03/08/16 0401 03/08/16 0817  BP: 129/75 129/62 123/79 126/68  Pulse: (!) 57 63 73 67  Resp: (!) 24 18 18    Temp: 97.8 F (36.6 C) 98 F (36.7 C) 98 F (36.7 C) 97.7 F (36.5 C)  TempSrc: Oral Oral Oral Oral  SpO2: 98% 96% 98% 96%  Weight:      Height:       Weight change:   Intake/Output Summary (Last 24 hours) at 03/08/16 0920 Last data filed at 03/08/16 3532  Gross per 24 hour  Intake             1192 ml  Output              725 ml  Net              467 ml     Exam: Heart:: Regular rate and rhythm Lungs: normal Abdomen: soft, mild RUQ tenderness, normal bowel sounds   Lab Results:  CBC Latest Ref Rng & Units 03/08/2016 03/07/2016  WBC 3.6 - 11.0 K/uL 4.7 9.1  Hemoglobin 12.0 - 16.0 g/dL 14.1 16.5(H)  Hematocrit 35.0 - 47.0 % 41.9 48.2(H)  Platelets 150 - 440 K/uL 118(L) 162     Hepatic Function Latest Ref Rng & Units 03/08/2016 03/08/2016 03/07/2016  Total Protein 6.5 - 8.1 g/dL - 5.7(L) 7.0  Albumin 3.5 - 5.0 g/dL - 2.8(L) 3.7  AST 15 - 41 U/L - 111(H) 78(H)  ALT 14 - 54 U/L - 53 35  Alk Phosphatase 38 - 126 U/L - 96 89  Total Bilirubin 0.3 - 1.2 mg/dL 5.1(H) 5.5(H) 4.0(H)  Bilirubin, Direct 0.1 - 0.5 mg/dL 3.2(H) - -    Micro Results: Recent Results (from the past 240 hour(s))  Urine culture     Status: Abnormal   Collection Time: 03/07/16  7:14 AM  Result Value Ref Range Status   Specimen Description URINE, RANDOM  Final   Special Requests NONE  Final   Culture MULTIPLE SPECIES PRESENT, SUGGEST RECOLLECTION (A)  Final   Report Status 03/08/2016 FINAL  Final    Studies/Results: Ct Abdomen Pelvis W Contrast  Result Date: 03/07/2016 CLINICAL DATA:  Abdomen pain for 6 weeks. 50 pound weight loss in 10 weeks. EXAM: CT ABDOMEN AND PELVIS WITH CONTRAST TECHNIQUE: Multidetector CT imaging of the abdomen and pelvis was performed using the standard protocol following bolus administration of intravenous contrast. CONTRAST:  14m ISOVUE-300 IOPAMIDOL (ISOVUE-300) INJECTION 61% COMPARISON:  MRI of the abdomen February 04, 2016 FINDINGS: Lower chest: There is minimal dependent atelectasis of the posterior lung bases. No focal pneumonia is identified. The heart size is normal. Hepatobiliary: There is a low-density lesion in the lateral posterior segment right lobe liver unchanged compared to prior MRI where it was described as a hemangioma. There is mild intrahepatic biliary ductal dilatation. The common bile duct measures 10 mm. The gallbladder demonstrates a thickened wall with surrounding fluid. Pancreas: There is patchy low density with stranding surrounding the pancreatic head. Spleen: Normal in size without focal abnormality. Adrenals/Urinary Tract: Low-density masses are identified in bilateral adrenal  glands consistent with previous MRI noted bilateral adrenal adenomas. Left kidney cysts is identified unchanged. The right kidney is normal. Stomach/Bowel: Stomach is within normal limits. Appendix appears normal. No evidence of bowel wall thickening, distention, or inflammatory changes. Vascular/Lymphatic: Aortic atherosclerosis. No enlarged abdominal or pelvic lymph nodes. Reproductive: Status post hysterectomy. No adnexal masses. Other: There is small umbilical herniation of mesenteric fat. Musculoskeletal: Degenerative joint changes of the spine are noted. IMPRESSION: Low-density identified in the pancreatic head with surrounding stranding ; the findings could be due to pancreatitis but low-density/partial cystic pancreatic mass is not excluded. There is gallbladder wall  thickening with pericholecystic fluid suspicious for cholecystitis. Intra and extrahepatic biliary ductal dilatation identified. Low-density lesion in the lateral posterior segment right lobe liver unchanged compared to prior MRI where it was described as a hemangioma. Low-density masses in bilateral adrenal glands unchanged compared to previous recent MRI where the lesions were described as bilateral adrenal adenomas. Electronically Signed   By: Abelardo Diesel M.D.   On: 03/07/2016 11:32   US Abdomen Limited Ruq  Result Date: 03/07/2016 CLINICAL DATA:  Pancreatitis ; jaundice; abdominal pain. EXAM: US ABDOMEN LIMITED - RIGHT UPPER QUADRANT COMPARISON:  03/07/2016 CT scan FINDINGS: Gallbladder: Gallbladder wall thickening at 5 mm, with trace pericholecystic fluid. Sludge in the gallbladder, without well-defined stones. Sonographic Murphy sign indeterminate due to pain medication. Common bile duct: Diameter: 9 mm Liver: Subtle intrahepatic biliary dilatation. A dominant 3.8 by 3.6 by 2.1 cm lesion peripherally in the right hepatic lobe is hyperechoic and compatible with the hemangioma shown previously at this location. The smaller hemangioma in the caudate lobe was not well seen on today's ultrasound. IMPRESSION: 1. Mild intrahepatic biliary dilatation with the dilated CBD at 9 mm, this is abnormally dilated, and could be the result of obstruction in the vicinity of the pancreatic head. Appearance on prior CT could possibly reflect a mass in the pancreatic head and adjacent body, versus focal pancreatitis. Even assuming the patient has signs and symptoms of focal pancreatitis, careful follow up to exclude pancreatic malignancy will be important. 2. There is gallbladder wall thickening at 5 mm with trace pericholecystic fluid, potentially from inflammation, correlate clinically in assessing for acute cholecystitis. Sludge noted in the gallbladder. Electronically Signed   By: Van Clines M.D.   On: 03/07/2016  12:46   Medications: I have reviewed the patient's current medications. Scheduled Meds: . FLUoxetine  20 mg Oral Daily  . gabapentin  300 mg Oral BID  . heparin  5,000 Units Subcutaneous Q8H  . potassium chloride  20 mEq Oral BID   Continuous Infusions: . 0.9 % NaCl with KCl 40 mEq / L 100 mL/hr (03/08/16 0012)   PRN Meds:.morphine injection, ondansetron (ZOFRAN) IV  BP 126/68   Pulse 67   Temp 97.7 F (36.5 C) (Oral)   Resp 18   Ht 5' 3"  (1.6 m)   Wt 172 lb 1.6 oz (78.1 kg)   SpO2 96%   BMI 30.49 kg/m    Assessment: Principal Problem:   Acute pancreatitis Active Problems:   Abdominal pain   Nausea   Weight loss   Elevated bilirubin  REESA GOTSCHALL is a 55 y.o. y/o female with abdominal pain for a few weeks duration. MRI abdomen on 02/04/2016 showed a normal pancreas, no gall stones and normal gall bladder. Today she has an elevated bilirubin , CBD 30m and CT scan evidence of pancreatic stranding(acute pancreatitis) +/- mass in her pancreas. It is possible that  she has developed gall stone pancreatitis or a growth in her pancreas is leading up to biliary blockage and pancreatitis.   Plan:  1. Continue IV fluids 2. Discussed with Dr Dahlia Byes too and felt she would benefit from MRCP today  3. Likely will need ERCP tomorrow.    LOS: 1 day   Jonathon Bellows 03/08/2016, 9:20 AM

## 2016-03-08 NOTE — Progress Notes (Signed)
Initial Nutrition Assessment  DOCUMENTATION CODES:   Severe malnutrition in context of acute illness/injury, Obesity unspecified  INTERVENTION:  Provide Boost Breeze po TID, each supplement provides 250 kcal and 9 grams of protein.  Encouraged adequate intake of calories and protein with meals to prevent further weight loss. Can address education needs pending plan.   NUTRITION DIAGNOSIS:   Inadequate oral intake related to poor appetite, other (see comment) (abdominal pain) as evidenced by per patient/family report, 13 percent weight loss over 3 months.  GOAL:   Patient will meet greater than or equal to 90% of their needs  MONITOR:   PO intake, Supplement acceptance, Diet advancement, Labs, Weight trends, I & O's  REASON FOR ASSESSMENT:   Malnutrition Screening Tool    ASSESSMENT:   55 y.o. female with a known history of Arthritis, bronchial pneumonia, hyperlipidemia, hypertension- started having generalized abdominal pain for last 10 weeks (worse in the past 2 weeks), pain is constant but fluctuating, associated with nausea and almost 50 pound weight loss in last 2 months.   -Patient s/p MRCP today. 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 Three liver hemangiomas found (no definite liver mets) and findings suspicious for nodal mets (portacaval lymphadenopathy).  -Per hepatobiliary imaging today patient has high-grade common bile duct obstruction. Per chart patient may need ERCP and transfer to tertiary medical center. -Patient was advanced to [facility] after assessment.  Spoke with patient at bedside. She reports poor appetite for the past 10 weeks in setting of abdominal pain. Endorses occasional nausea and taste changes (first food tasted very salty and now has no taste at all). Denies constipation/diarrhea. Patient reports these past 10 weeks she has been able to tolerate water, Jell-O and broth. Typical intake is 1-2 servings of  chicken noodle soup (max of 1 can per day), then snacks of yogurt between meals (wound only finish 1-2 yogurts per day).   Patient reporting UBW of 220 lbs and that she has lost 50 lbs in 10 weeks. Weights in Epic show patient at 163-165 lbs the past year. Per weights in Care Everywhere, patient was 212 lbs 03/04/2015 and 200 lbs 11/29/2015. Patient has lost 28 lbs (14% body weight) over 3 months, which is significant for time frame.  Medications reviewed and include: potassium chloride 20 mEq BID, NS with KCl 40 mEq/L @ 100 ml/hr.  Labs reviewed: Creatinine 0.39, Albumin 2.8, Lipase 55, AST 111, Total Protein 5.7, T Bili 5.5, Platelets 118.  Nutrition-Focused physical exam completed. Findings are no fat depletion, moderate muscle depletion, and mild edema.   Patient meets criteria for severe acute malnutrition in setting of 14% weight loss over 3 months, intake </= 50% of estimated energy requirement for >/= 5 days, moderate muscle depletion.  Diet Order:  Diet clear liquid Room service appropriate? Yes; Fluid consistency: Thin  Skin:  Reviewed, no issues  Last BM:  03/08/2016  Height:   Ht Readings from Last 1 Encounters:  03/07/16 5\' 3"  (1.6 m)    Weight:   Wt Readings from Last 1 Encounters:  03/07/16 172 lb 1.6 oz (78.1 kg)    Ideal Body Weight:  52.27 kg  BMI:  Body mass index is 30.49 kg/m.  Estimated Nutritional Needs:   Kcal:  1950-2345 (25-30 kcal/kg)  Protein:  95-115 grams (1.2-1.5 grams/kg)  Fluid:  >/= 1.9 L/day (25 ml/kg)  EDUCATION NEEDS:   Education needs no appropriate at this time  Willey Blade, MS, RD, LDN Pager: [phone] After  Hours Pager: [phone]

## 2016-03-09 ENCOUNTER — Inpatient Hospital Stay: Payer: Managed Care, Other (non HMO) | Admitting: Anesthesiology

## 2016-03-09 ENCOUNTER — Encounter
Admission: EM | Disposition: A | Discharge status: Home Health | Payer: Self-pay | Source: Home / Self Care | Attending: Internal Medicine

## 2016-03-09 DIAGNOSIS — D490 Neoplasm of unspecified behavior of digestive system: Secondary | ICD-10-CM

## 2016-03-09 DIAGNOSIS — K839 Disease of biliary tract, unspecified: Secondary | ICD-10-CM

## 2016-03-09 HISTORY — PX: ERCP: SHX5425

## 2016-03-09 LAB — COMPREHENSIVE METABOLIC PANEL
ALBUMIN: 2.8 g/dL — AB (ref 3.5–5.0)
ALK PHOS: 172 U/L — AB (ref 38–126)
ALT: 87 U/L — ABNORMAL HIGH (ref 14–54)
AST: 161 U/L — AB (ref 15–41)
Anion gap: 8 (ref 5–15)
BILIRUBIN TOTAL: 6.2 mg/dL — AB (ref 0.3–1.2)
BUN: 12 mg/dL (ref 6–20)
CALCIUM: 8.9 mg/dL (ref 8.9–10.3)
CO2: 22 mmol/L (ref 22–32)
Chloride: 111 mmol/L (ref 101–111)
Creatinine, Ser: 0.34 mg/dL — ABNORMAL LOW (ref 0.44–1.00)
GFR calc Af Amer: >60 mL/min (ref >60)
GFR calc non Af Amer: >60 mL/min (ref >60)
GLUCOSE: 156 mg/dL — AB (ref 65–99)
Potassium: 3.8 mmol/L (ref 3.5–5.1)
SODIUM: 141 mmol/L (ref 135–145)
TOTAL PROTEIN: 5.6 g/dL — AB (ref 6.5–8.1)

## 2016-03-09 LAB — BASIC METABOLIC PANEL
ANION GAP: 7 (ref 5–15)
BUN: 11 mg/dL (ref 6–20)
CHLORIDE: 112 mmol/L — AB (ref 101–111)
CO2: 22 mmol/L (ref 22–32)
Calcium: 8.8 mg/dL — ABNORMAL LOW (ref 8.9–10.3)
Creatinine, Ser: 0.33 mg/dL — ABNORMAL LOW (ref 0.44–1.00)
GFR calc non Af Amer: >60 mL/min (ref >60)
Glucose, Bld: 81 mg/dL (ref 65–99)
POTASSIUM: 4.2 mmol/L (ref 3.5–5.1)
Sodium: 141 mmol/L (ref 135–145)

## 2016-03-09 SURGERY — ERCP, WITH INTERVENTION IF INDICATED
Anesthesia: General

## 2016-03-09 MED ORDER — MIDAZOLAM HCL 2 MG/2ML IJ SOLN
INTRAMUSCULAR | Status: DC | PRN
  Administered 2016-03-09: 2 mg via INTRAVENOUS

## 2016-03-09 MED ORDER — GLUCAGON HCL RDNA (DIAGNOSTIC) 1 MG IJ SOLR
1.0000 mg | Freq: Once | INTRAMUSCULAR | Status: AC
  Administered 2016-03-09: .5 mL via INTRAVENOUS

## 2016-03-09 MED ORDER — PIPERACILLIN-TAZOBACTAM 3.375 G IVPB 30 MIN
3.3750 g | Freq: Once | INTRAVENOUS | Status: AC
  Administered 2016-03-09: 3.375 g via INTRAVENOUS
  Filled 2016-03-09 (×2): qty 50

## 2016-03-09 MED ORDER — INDOMETHACIN 50 MG RE SUPP: 100.0000 mg | Freq: Once | RECTAL | Status: DC

## 2016-03-09 MED ORDER — INDOMETHACIN 50 MG RE SUPP
100.0000 mg | Freq: Once | RECTAL | Status: AC
  Administered 2016-03-09: 100 mg via RECTAL

## 2016-03-09 MED ORDER — LACTATED RINGERS IV SOLN
INTRAVENOUS | Status: DC | PRN
  Administered 2016-03-09: 12:00:00 via INTRAVENOUS

## 2016-03-09 MED ORDER — SODIUM CHLORIDE 0.9 % IV SOLN
INTRAVENOUS | Status: DC
  Administered 2016-03-09: 1000 mL via INTRAVENOUS

## 2016-03-09 MED ORDER — GLUCAGON HCL RDNA (DIAGNOSTIC) 1 MG IJ SOLR
INTRAMUSCULAR | Status: AC
  Filled 2016-03-09: qty 1

## 2016-03-09 MED ORDER — PROPOFOL 500 MG/50ML IV EMUL
INTRAVENOUS | Status: DC | PRN
  Administered 2016-03-09: 50 ug/kg/min via INTRAVENOUS

## 2016-03-09 NOTE — Anesthesia Postprocedure Evaluation (Signed)
Anesthesia Post Note  Patient: Michelle Conway  Procedure(s) Performed: Procedure(s) (LRB): ENDOSCOPIC RETROGRADE CHOLANGIOPANCREATOGRAPHY (ERCP) (N/A)  Patient location during evaluation: PACU Anesthesia Type: General Level of consciousness: awake and alert Pain management: pain level controlled Vital Signs Assessment: post-procedure vital signs reviewed and stable Respiratory status: spontaneous breathing and respiratory function stable Cardiovascular status: stable Anesthetic complications: no    Last Vitals:  Vitals:   03/09/16 1237 03/09/16 1247  BP: 139/77 (!) 143/78  Pulse: 61 (!) 59  Resp: 16 10  Temp:      Last Pain:  Vitals:   03/09/16 1247  TempSrc:   PainSc: 3                  KEPHART,WILLIAM K

## 2016-03-09 NOTE — Anesthesia Preprocedure Evaluation (Signed)
Anesthesia Evaluation  Patient identified by MRN, date of birth, ID band Patient awake    Reviewed: Allergy & Precautions, NPO status , Patient's Chart, lab work & pertinent test results  History of Anesthesia Complications Negative for: history of anesthetic complications  Airway Mallampati: II  TM Distance: >3 FB Neck ROM: Full    Dental no notable dental hx.    Pulmonary neg sleep apnea, neg COPD, Current Smoker,    breath sounds clear to auscultation- rhonchi (-) wheezing      Cardiovascular Exercise Tolerance: Good hypertension, Pt. on medications (-) CAD and (-) Past MI  Rhythm:Regular Rate:Normal - Systolic murmurs and - Diastolic murmurs    Neuro/Psych  Headaches, negative psych ROS   GI/Hepatic Neg liver ROS, Admitted with pancreatic mass and bile duct obstruction    Endo/Other  negative endocrine ROSneg diabetes  Renal/GU negative Renal ROS     Musculoskeletal  (+) Arthritis ,   Abdominal (+) + obese,   Peds  Hematology negative hematology ROS (+)   Anesthesia Other Findings Past Medical History: No date: Allergy No date: Arthritis 11/2015: Bronchial pneumonia     Comment: patient currently being treated No date: Headache No date: Hyperlipidemia No date: Hypertension   Reproductive/Obstetrics                             Anesthesia Physical Anesthesia Plan  ASA: III  Anesthesia Plan: General   Post-op Pain Management:    Induction: Intravenous  Airway Management Planned: Natural Airway  Additional Equipment:   Intra-op Plan:   Post-operative Plan:   Informed Consent: I have reviewed the patients History and Physical, chart, labs and discussed the procedure including the risks, benefits and alternatives for the proposed anesthesia with the patient or authorized representative who has indicated his/her understanding and acceptance.   Dental advisory  given  Plan Discussed with: CRNA and Anesthesiologist  Anesthesia Plan Comments:         Anesthesia Quick Evaluation

## 2016-03-09 NOTE — Progress Notes (Signed)
[facility] [place] NAME: Michelle Conway    MR#:  [id number]  DATE OF BIRTH:  11-Dec-1960  SUBJECTIVE:  Came in with increasing abd pain and weight loss  REVIEW OF SYSTEMS:   Review of Systems  Constitutional: Positive for weight loss. Negative for chills and fever.  HENT: Negative for ear discharge, ear pain and nosebleeds.   Eyes: Negative for blurred vision, pain and discharge.  Respiratory: Negative for sputum production, shortness of breath, wheezing and stridor.   Cardiovascular: Negative for chest pain, palpitations, orthopnea and PND.  Gastrointestinal: Positive for abdominal pain. Negative for diarrhea, nausea and vomiting.  Genitourinary: Negative for frequency and urgency.  Musculoskeletal: Negative for back pain and joint pain.  Neurological: Positive for weakness. Negative for sensory change, speech change and focal weakness.  Psychiatric/Behavioral: Negative for depression and hallucinations. The patient is not nervous/anxious.    Tolerating Diet:npo Tolerating PT: not needed  DRUG ALLERGIES:   Allergies  Allergen Reactions  . No Known Allergies     VITALS:  Blood pressure (!) 143/78, pulse (!) 59, temperature (!) 96.7 F (35.9 C), temperature source Tympanic, resp. rate 10, height 5' 3"  (1.6 m), weight 78.1 kg (172 lb 1.6 oz), SpO2 100 %.  PHYSICAL EXAMINATION:   Physical Exam  GENERAL:  55 y.o.-year-old patient lying in the bed with no acute distress. Appears malnourished EYES: Pupils equal, round, reactive to light and accommodation.++ scleral icterus. Extraocular muscles intact.  HEENT: Head atraumatic, normocephalic. Oropharynx and nasopharynx clear.  NECK:  Supple, no jugular venous distention. No thyroid enlargement, no tenderness.  LUNGS: Normal breath sounds bilaterally, no wheezing, rales, rhonchi. No use of accessory muscles of respiration.  CARDIOVASCULAR: S1, S2 normal. No murmurs, rubs, or  gallops.  ABDOMEN: Soft, nontender, nondistended. Bowel sounds present. No organomegaly or mass.  EXTREMITIES: No cyanosis, clubbing or edema b/l.    NEUROLOGIC: Cranial nerves II through XII are intact. No focal Motor or sensory deficits b/l.   PSYCHIATRIC:  patient is alert and oriented x 3.  SKIN: No obvious rash, lesion, or ulcer.   LABORATORY PANEL:  CBC  Recent Labs Lab 03/08/16 0452  WBC 4.7  HGB 14.1  HCT 41.9  PLT 118*    Chemistries   Recent Labs Lab 03/08/16 0452 03/09/16 0454  NA 141 141  K 4.2 4.2  CL 108 112*  CO2 27 22  GLUCOSE 94 81  BUN 9 11  CREATININE 0.39* 0.33*  CALCIUM 8.8* 8.8*  MG 1.8  --   AST 111*  --   ALT 53  --   ALKPHOS 96  --   BILITOT 5.1*  5.5*  --    Cardiac Enzymes No results for input(s): TROPONINI in the last 168 hours. RADIOLOGY:  Nm Hepatobiliary Liver Func  Result Date: 03/08/2016 CLINICAL DATA:  Evaluate for cholecystitis. EXAM: NUCLEAR MEDICINE HEPATOBILIARY IMAGING TECHNIQUE: Sequential images of the abdomen were obtained out to 60 minutes following intravenous administration of radiopharmaceutical. RADIOPHARMACEUTICALS:  7.4 mCi Tc-21m Choletec IV COMPARISON:  None. FINDINGS: Prompt uptake and biliary excretion of activity by the liver is seen. No gallbladder activity is visualized. No bile activity within the small bowel, consistent with obstruction of the common bile duct versus hepatocyte function. IMPRESSION: 1. Imaging findings are compatible with a high-grade common bile duct obstruction and correspond with findings of an obstructing lesion arising from the pancreas as seen on MRI from today. Electronically Signed   By: TLovena Le  Clovis Riley M.D.   On: 03/08/2016 14:24   Mr Abdomen Mrcp Moise Boring Contast  Result Date: 03/08/2016 CLINICAL DATA:  55 year old female admitted with worsening abdominal pain, mildly elevated lipase, elevated serum bilirubin levels and new biliary ductal dilatation and ill-defined low-attenuation  throughout the pancreatic head on CT and ultrasound. EXAM: MRI ABDOMEN WITHOUT AND WITH CONTRAST (INCLUDING MRCP) TECHNIQUE: Multiplanar multisequence MR imaging of the abdomen was performed both before and after the administration of intravenous contrast. Heavily T2-weighted images of the biliary and pancreatic ducts were obtained, and three-dimensional MRCP images were rendered by post processing. CONTRAST:  33m MULTIHANCE GADOBENATE DIMEGLUMINE 529 MG/ML IV SOLN COMPARISON:  03/07/2016 right upper quadrant ultrasound and CT abdomen/ pelvis. 02/04/2016 MRI abdomen. FINDINGS: Motion degraded scan. Lower chest: Clear lung bases. Hepatobiliary: Normal liver size and configuration. No hepatic steatosis. There are three similar-appearing T2 hyperintense liver masses each demonstrating progressive discontinuous peripheral nodular enhancement consistent with benign hemangiomas, measuring 4.1 cm in the subcapsular inferior right liver lobe (series 18/ image 33), 0.8 cm in the medial segment 5 right liver lobe (series 18/ image 23) and 0.9 cm in the central segment 8 right liver lobe (series 18/ image 11), all stable in size since 02/04/2016 MRI. There are four additional scattered tiny liver lesions measuring up to the 0.5 cm (series 18/images 7, 9, 11 and 21), too small to accurately characterize, not appreciably changed in size since 02/04/2016. No new liver lesions. Mildly distended gallbladder. Sludge is seen layering in the gallbladder. Moderate diffuse gallbladder wall thickening. No gallstones. Mild diffuse intrahepatic biliary ductal dilatation. Common bile duct diameter 9 mm, mildly dilated. There is abrupt cut off of the common bile duct at the level of the pancreatic neck (series 3/ image 13). Pancreas: There is an infiltrative 3.2 x 2.7 x 3.1 cm pancreatic neck mass (series 23/image 7), which demonstrates restricted diffusion, mild T2 hyperintensity and heterogeneous hypoenhancement. There is associated mild  dilatation of the main pancreatic duct in the pancreatic body and tail (3 mm diameter). There is atrophy of the pancreatic body and tail. Spleen: Normal size. No mass. Adrenals/Urinary Tract: There are bilateral adrenal adenomas measuring 1.4 cm on the right and 6.4 cm on the left, both demonstrating significant loss of signal intensity on out of phase chemical shift imaging. No hydronephrosis. Simple 7.9 cm upper left renal cyst. No suspicious renal masses. Stomach/Bowel: Grossly normal stomach. Visualized small and large bowel is normal caliber, with no bowel wall thickening. Vascular/Lymphatic: Normal caliber abdominal aorta. Patent hepatic, renal and splenic veins. There is encasement and high-grade narrowing of the portosplenic venous confluence by the pancreatic neck mass (series 22/image 24 and series 16/ image 24). The mass appears to abut the common hepatic artery. The superior mesenteric artery is uninvolved by the mass. Enlarged 1.4 cm portacaval node (series 20/ image 26). No additional pathologically enlarged abdominal lymph nodes. Other: No abdominal ascites or focal fluid collection. Musculoskeletal: No aggressive appearing focal osseous lesions. IMPRESSION: 1. Infiltrative hypoenhancing 3.2 cm pancreatic neck mass with associated abrupt cut off of the common bile duct, mild main pancreatic duct dilation and atrophy of the pancreatic body and tail. 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 Encasement and high-grade narrowing of the portosplenic venous confluence by the mass. 2. Dilated common bile duct (9 mm diameter). Mild diffuse intrahepatic biliary ductal dilatation. Distended sludge filled thick walled gallbladder. No cholelithiasis. 3. Portacaval lymphadenopathy, suspicious for nodal metastasis. 4. No definite liver metastases. Three liver hemangiomas. Four additional tiny  scattered liver lesions measuring up to 0.5 cm, too small to accurately characterize, for which 1 month  stability has been demonstrated. Attention is recommended on follow-up MRI abdomen without and with IV contrast in 3-6 months. 5. Bilateral adrenal adenomas. These results were called by telephone at the time of interpretation on 03/08/2016 at 2:31 pm to Dr. Jonathon Bellows , who verbally acknowledged these results. Electronically Signed   By: Ilona Sorrel M.D.   On: 03/08/2016 14:37   ASSESSMENT AND PLAN:  Michelle Conway  is a 55 y.o. female with a known history of Arthritis, bronchial pneumonia, hyperlipidemia, hypertension- started having generalized abdominal pain for last 2 and half months, pain is constant but fluctuating, associated with nausea and almost 50 pound weight loss in last 2 months She was noted to have elevated bilirubin and lipase level and on CT and abdominal ultrasound she was noted to have possibly pancreatic head mass or obstructed CBD,  * Acute pancreatitis with Obstructive jaundice from mass arising from the pancreas -MRCP and HiDA positive -2 weeks ago MRI abdV fluids and keep nothing by mouth.   Patient ERCP could not be done because of lot of blood seen around the mass. Spoke with Dr. Ollen Bowl he recommended PTC by interventional radiology.     Patient's husband is very angry asking about delaying procedures explained to him about ERCP done and certain days with certain positions and the explained that ERCP was terminated because of blood around the tumor unable to proceed forward.  Pager GI to speak with husband directly. Spoke to husband in detail,he is appreciative,said (you are thr first one coming to room and talking )he said he does not know whats going on/  * Elevated bilirubin, nausea and weight loss   There is a dilated common bile duct on radiological imaging.   Possibility of pancreatic head mass is also suspected.   Appreciate GI and surgical consult      * Hypertension   Blood pressure is stable, monitor.  * Hypokalemia   Replace IV and oral, check  magnesium.  * Smoking   Tobacco cessation counseling was done for 4 minutes by me offered nicotine patch.  D/w dr Vicente Males and pt's husband  Case discussed with Care Management/Social Worker. Management plans discussed with the patient, family and they are in agreement.  CODE STATUS: FULL  DVT Prophylaxis: SCD  TOTAL TIME TAKING CARE OF THIS PATIENT: 30 minutes.  >50% time spent on counselling and coordination of care  POSSIBLE D/C IN 2-3 DAYS, DEPENDING ON CLINICAL CONDITION.  Note: This dictation was prepared with Dragon dictation along with smaller phrase technology. Any transcriptional errors that result from this process are unintentional.  Zakhari Fogel M.D on 03/09/2016 at 2:40 PM  Between 7am to 6pm - Pager - [phone]  After 6pm go to [email or web address] - password [email or web address] [facility]  Office  [phone]  CC: Primary care physician; Cletis Athens, MD

## 2016-03-09 NOTE — Transfer of Care (Signed)
Immediate Anesthesia Transfer of Care Note  Patient: Michelle Conway  Procedure(s) Performed: Procedure(s): ENDOSCOPIC RETROGRADE CHOLANGIOPANCREATOGRAPHY (ERCP) (N/A)  Patient Location: PACU  Anesthesia Type:General  Level of Consciousness: awake, alert  and oriented  Airway & Oxygen Therapy: Patient Spontanous Breathing and Patient connected to nasal cannula oxygen  Post-op Assessment: Report given to RN and Post -op Vital signs reviewed and stable  Post vital signs: Reviewed and stable  Last Vitals:  Vitals:   03/09/16 0531 03/09/16 1056  BP: 132/75 138/77  Pulse: 76 70  Resp: 18 18  Temp: 36.8 C 36.4 C    Last Pain:  Vitals:   03/09/16 1056  TempSrc: Tympanic  PainSc:       Patients Stated Pain Goal: 1 ([id number])  Complications: No apparent anesthesia complications

## 2016-03-09 NOTE — OR Nursing (Signed)
Report called to Ines Bloomer, RN.  Vital sign stable, patient visibly upset, comforting measures given to patient.

## 2016-03-09 NOTE — Op Note (Addendum)
[facility] Gastroenterology Patient Name: Michelle Conway Procedure Date: 03/09/2016 11:29 AM MRN: [id number] Account #: [id number] Date of Birth: December 27, 1960 Admit Type: Inpatient Age: 55 Room: [facility] ENDO ROOM 4 Gender: Female Note Status: Finalized Procedure:            ERCP Indications:          Tumor of the head of pancreas Providers:            Lucilla Lame MD, MD Referring MD:         Cletis Athens, MD (Referring MD) Medicines:            Propofol per Anesthesia Complications:        No immediate complications. Procedure:            Pre-Anesthesia Assessment:                       - Prior to the procedure, a History and Physical was                        performed, and patient medications and allergies were                        reviewed. The patient's tolerance of previous                        anesthesia was also reviewed. The risks and benefits of                        the procedure and the sedation options and risks were                        discussed with the patient. All questions were                        answered, and informed consent was obtained. Prior                        Anticoagulants: The patient has taken no previous                        anticoagulant or antiplatelet agents. ASA Grade                        Assessment: III - A patient with severe systemic                        disease. After reviewing the risks and benefits, the                        patient was deemed in satisfactory condition to undergo                        the procedure.                       After obtaining informed consent, the scope was passed                        under direct vision. Throughout the procedure, the  patient's blood pressure, pulse, and oxygen saturations                        were monitored continuously. The Endoscope was                        introduced through the mouth, and used to inject   contrast into without successful cannulation. The ERCP                        was accomplished without difficulty. The patient                        tolerated the procedure well. Findings:      The scout film was normal. The upper GI tract was traversed under direct       vision without detailed examination. Hematin (altered       blood/coffee-ground-like material) was found in the entire examined       stomach. A large frondlike/villous mass was found at the major papilla.       The bile duct could not be cannulated. The ampulla was engulfed in a       friable mass that was oozing blood and covered with clots.      Canulation was not achieved. Impression:           - The major papilla appeared to have a mass.                       - Cannulation not achieved Recommendation:       - Patient needs interventional radiology for a PTC. Procedure Code(s):    --- Professional ---                       [id number], Esophagogastroduodenoscopy, flexible, transoral;                        diagnostic, including collection of specimen(s) by                        brushing or washing, when performed (separate procedure) Diagnosis Code(s):    --- Professional ---                       D49.0, Neoplasm of unspecified behavior of digestive                        system                       K83.9, Disease of biliary tract, unspecified CPT copyright 2016 American Medical Association. All rights reserved. The codes documented in this report are preliminary and upon coder review may  be revised to meet current compliance requirements. Lucilla Lame MD, MD 03/09/2016 12:14:56 PM This report has been signed electronically. Number of Addenda: 0 Note Initiated On: 03/09/2016 11:29 AM      [facility]

## 2016-03-09 NOTE — OR Nursing (Signed)
IV site in right AC red and swollen.  IV discontinued and right wrist site used for infusion.  This was started on the nurisng unit.

## 2016-03-10 ENCOUNTER — Encounter: Payer: Self-pay | Admitting: Gastroenterology

## 2016-03-10 ENCOUNTER — Inpatient Hospital Stay: Payer: Managed Care, Other (non HMO)

## 2016-03-10 HISTORY — PX: IR GENERIC HISTORICAL: IMG1180011

## 2016-03-10 LAB — CBC
HEMATOCRIT: 44.6 % (ref 35.0–47.0)
HEMOGLOBIN: 14.7 g/dL (ref 12.0–16.0)
MCH: 32.9 pg (ref 26.0–34.0)
MCHC: 32.9 g/dL (ref 32.0–36.0)
MCV: 100 fL (ref 80.0–100.0)
Platelets: 110 10*3/uL — ABNORMAL LOW (ref 150–440)
RBC: 4.46 MIL/uL (ref 3.80–5.20)
RDW: 14.1 % (ref 11.5–14.5)
WBC: 4.9 10*3/uL (ref 3.6–11.0)

## 2016-03-10 MED ORDER — SODIUM CHLORIDE 0.9% FLUSH
10.0000 mL | Freq: Two times a day (BID) | INTRAVENOUS | Status: DC
  Administered 2016-03-10 – 2016-03-11 (×2): 10 mL

## 2016-03-10 MED ORDER — MIDAZOLAM HCL 5 MG/5ML IJ SOLN
INTRAMUSCULAR | Status: AC | PRN
  Administered 2016-03-10: 2 mg via INTRAVENOUS
  Administered 2016-03-10 (×5): 0.5 mg via INTRAVENOUS

## 2016-03-10 MED ORDER — SODIUM CHLORIDE 0.9 % IV SOLN
INTRAVENOUS | Status: DC
  Administered 2016-03-10: 09:00:00 via INTRAVENOUS

## 2016-03-10 MED ORDER — FENTANYL CITRATE (PF) 100 MCG/2ML IJ SOLN
INTRAMUSCULAR | Status: AC
  Filled 2016-03-10: qty 4

## 2016-03-10 MED ORDER — FENTANYL CITRATE (PF) 100 MCG/2ML IJ SOLN
INTRAMUSCULAR | Status: AC | PRN
  Administered 2016-03-10 (×2): 25 ug via INTRAVENOUS
  Administered 2016-03-10: 50 ug via INTRAVENOUS
  Administered 2016-03-10 (×4): 25 ug via INTRAVENOUS

## 2016-03-10 MED ORDER — PIPERACILLIN-TAZOBACTAM 3.375 G IVPB 30 MIN
3.3750 g | Freq: Once | INTRAVENOUS | Status: AC
  Administered 2016-03-10: 3.375 g via INTRAVENOUS
  Filled 2016-03-10: qty 50

## 2016-03-10 MED ORDER — IOPAMIDOL (ISOVUE-300) INJECTION 61%
30.0000 mL | Freq: Once | INTRAVENOUS | Status: AC | PRN
  Administered 2016-03-10: 20 mL

## 2016-03-10 MED ORDER — MIDAZOLAM HCL 5 MG/5ML IJ SOLN
INTRAMUSCULAR | Status: AC
  Filled 2016-03-10: qty 10

## 2016-03-10 MED ORDER — LIDOCAINE HCL (PF) 1 % IJ SOLN
INTRAMUSCULAR | Status: AC | PRN
  Administered 2016-03-10: 6 mL

## 2016-03-10 NOTE — Progress Notes (Signed)
[facility] [place] NAME: Michelle Conway    MR#:  [id number]  DATE OF BIRTH:  1961/01/27  SUBJECTIVE:  seen in specials after PTC. A lot of pain, biliary stent is placed to relieve the obstruction, woke with Dr. Rosalita Chessman, saw the patient. Patient wants to go home likely discharge home tomorrow with home health to take care of this biliary drain.pt states the GI appointment at [facility] for further treatment of pancreatic cancer including the endoscopic ultrasound biopsy.  She  understands this plan.  REVIEW OF SYSTEMS:   Review of Systems  Constitutional: Positive for weight loss. Negative for chills and fever.  HENT: Negative for ear discharge, ear pain and nosebleeds.   Eyes: Negative for blurred vision, pain and discharge.  Respiratory: Negative for sputum production, shortness of breath, wheezing and stridor.   Cardiovascular: Negative for chest pain, palpitations, orthopnea and PND.  Gastrointestinal: Positive for abdominal pain. Negative for diarrhea, nausea and vomiting.  Genitourinary: Negative for frequency and urgency.  Musculoskeletal: Negative for back pain and joint pain.  Neurological: Positive for weakness. Negative for sensory change, speech change and focal weakness.  Psychiatric/Behavioral: Negative for depression and hallucinations. The patient is not nervous/anxious.    Tolerating Diet:npo Tolerating PT: not needed  DRUG ALLERGIES:   Allergies  Allergen Reactions  . No Known Allergies     VITALS:  Blood pressure (!) 164/93, pulse 79, temperature 98 F (36.7 C), temperature source Oral, resp. rate 17, height 5' 3"  (1.6 m), weight 78.1 kg (172 lb 1.6 oz), SpO2 98 %.  PHYSICAL EXAMINATION:   Physical Exam  GENERAL:  55 y.o.-year-old patient lying in the bed with no acute distress. Appears malnourished EYES: Pupils equal, round, reactive to light and accommodation.++ scleral icterus. Extraocular muscles intact.   HEENT: Head atraumatic, normocephalic. Oropharynx and nasopharynx clear.  NECK:  Supple, no jugular venous distention. No thyroid enlargement, no tenderness.  LUNGS: Normal breath sounds bilaterally, no wheezing, rales, rhonchi. No use of accessory muscles of respiration.  CARDIOVASCULAR: S1, S2 normal. No murmurs, rubs, or gallops.  ABDOMEN: Soft, nontender, nondistended. Bowel sounds present. No organomegaly or mass. Biliary drain present in the right upper quadrant.  EXTREMITIES: No cyanosis, clubbing or edema b/l.    NEUROLOGIC: Cranial nerves II through XII are intact. No focal Motor or sensory deficits b/l.   PSYCHIATRIC:  patient is alert and oriented x 3.  SKIN: No obvious rash, lesion, or ulcer.   LABORATORY PANEL:  CBC  Recent Labs Lab 03/08/16 0452  WBC 4.7  HGB 14.1  HCT 41.9  PLT 118*    Chemistries   Recent Labs Lab 03/08/16 0452  03/09/16 1527  NA 141  < > 141  K 4.2  < > 3.8  CL 108  < > 111  CO2 27  < > 22  GLUCOSE 94  < > 156*  BUN 9  < > 12  CREATININE 0.39*  < > 0.34*  CALCIUM 8.8*  < > 8.9  MG 1.8  --   --   AST 111*  --  161*  ALT 53  --  87*  ALKPHOS 96  --  172*  BILITOT 5.1*  5.5*  --  6.2*  < > = values in this interval not displayed. Cardiac Enzymes No results for input(s): TROPONINI in the last 168 hours. RADIOLOGY:  Nm Hepatobiliary Liver Func  Result Date: 03/08/2016 CLINICAL DATA:  Evaluate for cholecystitis. EXAM: NUCLEAR MEDICINE  HEPATOBILIARY IMAGING TECHNIQUE: Sequential images of the abdomen were obtained out to 60 minutes following intravenous administration of radiopharmaceutical. RADIOPHARMACEUTICALS:  7.4 mCi Tc-110m Choletec IV COMPARISON:  None. FINDINGS: Prompt uptake and biliary excretion of activity by the liver is seen. No gallbladder activity is visualized. No bile activity within the small bowel, consistent with obstruction of the common bile duct versus hepatocyte function. IMPRESSION: 1. Imaging findings are  compatible with a high-grade common bile duct obstruction and correspond with findings of an obstructing lesion arising from the pancreas as seen on MRI from today. Electronically Signed   By: TKerby MoorsM.D.   On: 03/08/2016 14:24   Mr Abdomen Mrcp WMoise BoringContast  Result Date: 03/08/2016 CLINICAL DATA:  55year old female admitted with worsening abdominal pain, mildly elevated lipase, elevated serum bilirubin levels and new biliary ductal dilatation and ill-defined low-attenuation throughout the pancreatic head on CT and ultrasound. EXAM: MRI ABDOMEN WITHOUT AND WITH CONTRAST (INCLUDING MRCP) TECHNIQUE: Multiplanar multisequence MR imaging of the abdomen was performed both before and after the administration of intravenous contrast. Heavily T2-weighted images of the biliary and pancreatic ducts were obtained, and three-dimensional MRCP images were rendered by post processing. CONTRAST:  195mMULTIHANCE GADOBENATE DIMEGLUMINE 529 MG/ML IV SOLN COMPARISON:  03/07/2016 right upper quadrant ultrasound and CT abdomen/ pelvis. 02/04/2016 MRI abdomen. FINDINGS: Motion degraded scan. Lower chest: Clear lung bases. Hepatobiliary: Normal liver size and configuration. No hepatic steatosis. There are three similar-appearing T2 hyperintense liver masses each demonstrating progressive discontinuous peripheral nodular enhancement consistent with benign hemangiomas, measuring 4.1 cm in the subcapsular inferior right liver lobe (series 18/ image 33), 0.8 cm in the medial segment 5 right liver lobe (series 18/ image 23) and 0.9 cm in the central segment 8 right liver lobe (series 18/ image 11), all stable in size since 02/04/2016 MRI. There are four additional scattered tiny liver lesions measuring up to the 0.5 cm (series 18/images 7, 9, 11 and 21), too small to accurately characterize, not appreciably changed in size since 02/04/2016. No new liver lesions. Mildly distended gallbladder. Sludge is seen layering in the  gallbladder. Moderate diffuse gallbladder wall thickening. No gallstones. Mild diffuse intrahepatic biliary ductal dilatation. Common bile duct diameter 9 mm, mildly dilated. There is abrupt cut off of the common bile duct at the level of the pancreatic neck (series 3/ image 13). Pancreas: There is an infiltrative 3.2 x 2.7 x 3.1 cm pancreatic neck mass (series 23/image 7), which demonstrates restricted diffusion, mild T2 hyperintensity and heterogeneous hypoenhancement. There is associated mild dilatation of the main pancreatic duct in the pancreatic body and tail (3 mm diameter). There is atrophy of the pancreatic body and tail. Spleen: Normal size. No mass. Adrenals/Urinary Tract: There are bilateral adrenal adenomas measuring 1.4 cm on the right and 6.4 cm on the left, both demonstrating significant loss of signal intensity on out of phase chemical shift imaging. No hydronephrosis. Simple 7.9 cm upper left renal cyst. No suspicious renal masses. Stomach/Bowel: Grossly normal stomach. Visualized small and large bowel is normal caliber, with no bowel wall thickening. Vascular/Lymphatic: Normal caliber abdominal aorta. Patent hepatic, renal and splenic veins. There is encasement and high-grade narrowing of the portosplenic venous confluence by the pancreatic neck mass (series 22/image 24 and series 16/ image 24). The mass appears to abut the common hepatic artery. The superior mesenteric artery is uninvolved by the mass. Enlarged 1.4 cm portacaval node (series 20/ image 26). No additional pathologically enlarged abdominal lymph nodes. Other: No  abdominal ascites or focal fluid collection. Musculoskeletal: No aggressive appearing focal osseous lesions. IMPRESSION: 1. Infiltrative hypoenhancing 3.2 cm pancreatic neck mass with associated abrupt cut off of the common bile duct, mild main pancreatic duct dilation and atrophy of the pancreatic body and tail. 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  Encasement and high-grade narrowing of the portosplenic venous confluence by the mass. 2. Dilated common bile duct (9 mm diameter). Mild diffuse intrahepatic biliary ductal dilatation. Distended sludge filled thick walled gallbladder. No cholelithiasis. 3. Portacaval lymphadenopathy, suspicious for nodal metastasis. 4. No definite liver metastases. Three liver hemangiomas. Four additional tiny scattered liver lesions measuring up to 0.5 cm, too small to accurately characterize, for which 1 month stability has been demonstrated. Attention is recommended on follow-up MRI abdomen without and with IV contrast in 3-6 months. 5. Bilateral adrenal adenomas. These results were called by telephone at the time of interpretation on 03/08/2016 at 2:31 pm to Dr. Jonathon Bellows , who verbally acknowledged these results. Electronically Signed   By: Ilona Sorrel M.D.   On: 03/08/2016 14:37   ASSESSMENT AND PLAN:  Michelle Conway  is a 55 y.o. female with a known history of Arthritis, bronchial pneumonia, hyperlipidemia, hypertension- started having generalized abdominal pain for last 2 and half months, pain is constant but fluctuating, associated with nausea and almost 50 pound weight loss in last 2 months She was noted to have elevated bilirubin and lipase level and on CT and abdominal ultrasound she was noted to have possibly pancreatic head mass or obstructed CBD,  * Acute pancreatitis with Obstructive jaundice from mass arising from the pancreas -MRCP and HiDA positive -2  Patient ERCP could not be done because of lot of blood seen around the mass. Spoke with Dr. Ollen Bowl he recommended PTC by interventional radiology.     Status post PTC test today, had percutaneous internal/external biliary drainage. Noted to have high-grade obstruction in mid, distal CBD obstruction is able to pass with the drain to the level of duodenum. Biliary sample sent for cytology.   * Pancreatic mass with obstructive jaundice status post  biliary drain PTC. Patient needs a heart patient peripheral at [facility] for possible lower tissue diagnosis and possible Whipple procedure, further treatment options for pancreatic cancer. Likely discharge home tomorrow with by mouth pain medication, home health for biliary drain care. Check CBC today.    Needs monitoring overnight unable to discharge today.  * Hypertension   Blood pressure is stable, monitor.  * Hypokalemia'; corrected    * Smoking   Tobacco cessation counseling was done for 4 minutes by me offered nicotine patch.  D/w dr Vicente Males and pt's husband  Case discussed with Care Management/Social Worker. Management plans discussed with the patient, family and they are in agreement.  CODE STATUS: FULL  DVT Prophylaxis: SCD  TOTAL TIME TAKING CARE OF THIS PATIENT: 30 minutes.  >50% time spent on counselling and coordination of care  POSSIBLE D/C IN 2-3 DAYS, DEPENDING ON CLINICAL CONDITION.  Note: This dictation was prepared with Dragon dictation along with smaller phrase technology. Any transcriptional errors that result from this process are unintentional.  Clent Damore M.D on 03/10/2016 at 12:10 PM  Between 7am to 6pm - Pager - [phone]  After 6pm go to [email or web address] - password [email or web address] [facility]  Office  [phone]  CC: Primary care physician; Cletis Athens, MD

## 2016-03-10 NOTE — H&P (Signed)
Chief Complaint: Patient was seen in consultation today for percutaneous biliary drainage at the request of Dr. Allen Norris.  Referring Physician(s): Allen Norris, Darren  Patient Status: [facility] - In-pt  History of Present Illness: Michelle Conway is a 55 y.o. female with biliary obstruction due to a pancreatic head/ampullary mass with rising bilirubin and jaundice.  ERCP yesterday unsuccessful in cannulating the common bile duct due to a friable ampullary mass.  Now presents for percutaneous transhepatic biliary access for cholangiography and biliary drainage.  Past Medical History:  Diagnosis Date  . Allergy   . Arthritis   . Bronchial pneumonia 11/2015   patient currently being treated  . Headache   . Hyperlipidemia   . Hypertension     Past Surgical History:  Procedure Laterality Date  . ABDOMINAL HYSTERECTOMY    . ERCP N/A 03/09/2016   Procedure: ENDOSCOPIC RETROGRADE CHOLANGIOPANCREATOGRAPHY (ERCP);  Surgeon: Lucilla Lame, MD;  Location: [facility] ENDOSCOPY;  Service: Endoscopy;  Laterality: N/A;  . JOINT REPLACEMENT Bilateral    knees  . KNEE SURGERY Right   . KNEE SURGERY Left   . TONSILLECTOMY Bilateral   . TONSILLECTOMY AND ADENOIDECTOMY N/A     Allergies: No known allergies  Medications: Prior to Admission medications   Medication Sig Start Date End Date Taking? Authorizing Provider  benzonatate (TESSALON) 100 MG capsule Take by mouth 3 (three) times daily.   Yes Historical Provider, MD  cetirizine (ZYRTEC) 10 MG tablet Take 10 mg by mouth daily as needed. Reported on 08/02/2015   Yes Historical Provider, MD  FLUoxetine (PROZAC) 20 MG tablet Take 20 mg by mouth daily.   Yes Historical Provider, MD  gabapentin (NEURONTIN) 300 MG capsule Limit 1 tablet by mouth per day or twice a day if tolerated Patient taking differently: Take 300 mg by mouth 2 (two) times daily. Limit 1 tablet by mouth per day or twice a day if tolerated 12/02/15  Yes Mohammed Kindle, MD  HYDROcodone-acetaminophen  (NORCO/VICODIN) 5-325 MG tablet Limit one tab po bid if tolerated. Patient taking differently: Take 1 tablet by mouth 2 (two) times daily. Limit one tab po bid if tolerated. 12/02/15  Yes Mohammed Kindle, MD  lisinopril-hydrochlorothiazide (PRINZIDE,ZESTORETIC) 20-25 MG per tablet Take 1 tablet by mouth daily.   Yes Historical Provider, MD  orphenadrine (NORFLEX) 100 MG tablet Limit 1 tablet by mouth per day or twice per day if tolerated 12/02/15  Yes Mohammed Kindle, MD  RABEprazole (ACIPHEX) 20 MG tablet Take 20 mg by mouth daily.   Yes Historical Provider, MD  simvastatin (ZOCOR) 40 MG tablet Take 20 mg by mouth daily.   Yes Historical Provider, MD  topiramate (TOPAMAX) 25 MG tablet Take 25 mg by mouth 2 (two) times daily as needed.    Yes Historical Provider, MD     Family History  Problem Relation Age of Onset  . Heart disease Mother   . Diabetes Mother   . Kidney disease Mother   . Heart disease Father   . Muscular dystrophy Father     Social History   Social History  . Marital status: Married    Spouse name: N/A  . Number of children: N/A  . Years of education: N/A   Social History Main Topics  . Smoking status: Heavy Tobacco Smoker    Packs/day: 0.50  . Smokeless tobacco: Never Used  . Alcohol use No  . Drug use: No  . Sexual activity: No   Other Topics Concern  . None  Social History Narrative  . None    Review of Systems: A 12 point ROS discussed and pertinent positives are indicated in the HPI above.  All other systems are negative.  Review of Systems  Constitutional: Negative.   Respiratory: Negative.   Cardiovascular: Negative.   Gastrointestinal: Positive for abdominal pain. Negative for abdominal distention, anal bleeding, blood in stool, constipation, diarrhea, nausea and rectal pain.  Genitourinary: Negative.   Musculoskeletal: Negative.   Neurological: Negative.     Vital Signs: BP (!) 158/83   Pulse 64   Temp 98 F (36.7 C) (Oral)   Resp 19    Ht 5' 3"  (1.6 m)   Wt 172 lb 1.6 oz (78.1 kg)   SpO2 98%   BMI 30.49 kg/m   Physical Exam  Constitutional: She is oriented to person, place, and time. No distress.  HENT:  Head: Normocephalic and atraumatic.  Neck: Neck supple. No JVD present.  Cardiovascular: Normal rate, regular rhythm and normal heart sounds.  Exam reveals no gallop and no friction rub.   No murmur heard. Pulmonary/Chest: Effort normal and breath sounds normal. No respiratory distress. She has no wheezes. She has no rales.  Abdominal: Soft. Bowel sounds are normal. She exhibits no distension. There is tenderness.  Musculoskeletal: She exhibits no edema.  Neurological: She is alert and oriented to person, place, and time.  Skin: She is not diaphoretic.    Mallampati Score:  MD Evaluation Airway: WNL Heart: WNL Abdomen: WNL Chest/ Lungs: WNL ASA  Classification: 2 Mallampati/Airway Score: One  Imaging: Nm Hepatobiliary Liver Func  Result Date: 03/08/2016 CLINICAL DATA:  Evaluate for cholecystitis. EXAM: NUCLEAR MEDICINE HEPATOBILIARY IMAGING TECHNIQUE: Sequential images of the abdomen were obtained out to 60 minutes following intravenous administration of radiopharmaceutical. RADIOPHARMACEUTICALS:  7.4 mCi Tc-2m Choletec IV COMPARISON:  None. FINDINGS: Prompt uptake and biliary excretion of activity by the liver is seen. No gallbladder activity is visualized. No bile activity within the small bowel, consistent with obstruction of the common bile duct versus hepatocyte function. IMPRESSION: 1. Imaging findings are compatible with a high-grade common bile duct obstruction and correspond with findings of an obstructing lesion arising from the pancreas as seen on MRI from today. Electronically Signed   By: TKerby MoorsM.D.   On: 03/08/2016 14:24   Ct Abdomen Pelvis W Contrast  Result Date: 03/07/2016 CLINICAL DATA:  Abdomen pain for 6 weeks. 50 pound weight loss in 10 weeks. EXAM: CT ABDOMEN AND PELVIS WITH  CONTRAST TECHNIQUE: Multidetector CT imaging of the abdomen and pelvis was performed using the standard protocol following bolus administration of intravenous contrast. CONTRAST:  1015mISOVUE-300 IOPAMIDOL (ISOVUE-300) INJECTION 61% COMPARISON:  MRI of the abdomen February 04, 2016 FINDINGS: Lower chest: There is minimal dependent atelectasis of the posterior lung bases. No focal pneumonia is identified. The heart size is normal. Hepatobiliary: There is a low-density lesion in the lateral posterior segment right lobe liver unchanged compared to prior MRI where it was described as a hemangioma. There is mild intrahepatic biliary ductal dilatation. The common bile duct measures 10 mm. The gallbladder demonstrates a thickened wall with surrounding fluid. Pancreas: There is patchy low density with stranding surrounding the pancreatic head. Spleen: Normal in size without focal abnormality. Adrenals/Urinary Tract: Low-density masses are identified in bilateral adrenal glands consistent with previous MRI noted bilateral adrenal adenomas. Left kidney cysts is identified unchanged. The right kidney is normal. Stomach/Bowel: Stomach is within normal limits. Appendix appears normal. No evidence of bowel  wall thickening, distention, or inflammatory changes. Vascular/Lymphatic: Aortic atherosclerosis. No enlarged abdominal or pelvic lymph nodes. Reproductive: Status post hysterectomy. No adnexal masses. Other: There is small umbilical herniation of mesenteric fat. Musculoskeletal: Degenerative joint changes of the spine are noted. IMPRESSION: Low-density identified in the pancreatic head with surrounding stranding ; the findings could be due to pancreatitis but low-density/partial cystic pancreatic mass is not excluded. There is gallbladder wall thickening with pericholecystic fluid suspicious for cholecystitis. Intra and extrahepatic biliary ductal dilatation identified. Low-density lesion in the lateral posterior segment right  lobe liver unchanged compared to prior MRI where it was described as a hemangioma. Low-density masses in bilateral adrenal glands unchanged compared to previous recent MRI where the lesions were described as bilateral adrenal adenomas. Electronically Signed   By: Abelardo Diesel M.D.   On: 03/07/2016 11:32   Mr Abdomen Mrcp Moise Boring Contast  Result Date: 03/08/2016 CLINICAL DATA:  55 year old female admitted with worsening abdominal pain, mildly elevated lipase, elevated serum bilirubin levels and new biliary ductal dilatation and ill-defined low-attenuation throughout the pancreatic head on CT and ultrasound. EXAM: MRI ABDOMEN WITHOUT AND WITH CONTRAST (INCLUDING MRCP) TECHNIQUE: Multiplanar multisequence MR imaging of the abdomen was performed both before and after the administration of intravenous contrast. Heavily T2-weighted images of the biliary and pancreatic ducts were obtained, and three-dimensional MRCP images were rendered by post processing. CONTRAST:  35m MULTIHANCE GADOBENATE DIMEGLUMINE 529 MG/ML IV SOLN COMPARISON:  03/07/2016 right upper quadrant ultrasound and CT abdomen/ pelvis. 02/04/2016 MRI abdomen. FINDINGS: Motion degraded scan. Lower chest: Clear lung bases. Hepatobiliary: Normal liver size and configuration. No hepatic steatosis. There are three similar-appearing T2 hyperintense liver masses each demonstrating progressive discontinuous peripheral nodular enhancement consistent with benign hemangiomas, measuring 4.1 cm in the subcapsular inferior right liver lobe (series 18/ image 33), 0.8 cm in the medial segment 5 right liver lobe (series 18/ image 23) and 0.9 cm in the central segment 8 right liver lobe (series 18/ image 11), all stable in size since 02/04/2016 MRI. There are four additional scattered tiny liver lesions measuring up to the 0.5 cm (series 18/images 7, 9, 11 and 21), too small to accurately characterize, not appreciably changed in size since 02/04/2016. No new liver lesions.  Mildly distended gallbladder. Sludge is seen layering in the gallbladder. Moderate diffuse gallbladder wall thickening. No gallstones. Mild diffuse intrahepatic biliary ductal dilatation. Common bile duct diameter 9 mm, mildly dilated. There is abrupt cut off of the common bile duct at the level of the pancreatic neck (series 3/ image 13). Pancreas: There is an infiltrative 3.2 x 2.7 x 3.1 cm pancreatic neck mass (series 23/image 7), which demonstrates restricted diffusion, mild T2 hyperintensity and heterogeneous hypoenhancement. There is associated mild dilatation of the main pancreatic duct in the pancreatic body and tail (3 mm diameter). There is atrophy of the pancreatic body and tail. Spleen: Normal size. No mass. Adrenals/Urinary Tract: There are bilateral adrenal adenomas measuring 1.4 cm on the right and 6.4 cm on the left, both demonstrating significant loss of signal intensity on out of phase chemical shift imaging. No hydronephrosis. Simple 7.9 cm upper left renal cyst. No suspicious renal masses. Stomach/Bowel: Grossly normal stomach. Visualized small and large bowel is normal caliber, with no bowel wall thickening. Vascular/Lymphatic: Normal caliber abdominal aorta. Patent hepatic, renal and splenic veins. There is encasement and high-grade narrowing of the portosplenic venous confluence by the pancreatic neck mass (series 22/image 24 and series 16/ image 24). The mass appears to abut  the common hepatic artery. The superior mesenteric artery is uninvolved by the mass. Enlarged 1.4 cm portacaval node (series 20/ image 26). No additional pathologically enlarged abdominal lymph nodes. Other: No abdominal ascites or focal fluid collection. Musculoskeletal: No aggressive appearing focal osseous lesions. IMPRESSION: 1. Infiltrative hypoenhancing 3.2 cm pancreatic neck mass with associated abrupt cut off of the common bile duct, mild main pancreatic duct dilation and atrophy of the pancreatic body and tail.  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 Encasement and high-grade narrowing of the portosplenic venous confluence by the mass. 2. Dilated common bile duct (9 mm diameter). Mild diffuse intrahepatic biliary ductal dilatation. Distended sludge filled thick walled gallbladder. No cholelithiasis. 3. Portacaval lymphadenopathy, suspicious for nodal metastasis. 4. No definite liver metastases. Three liver hemangiomas. Four additional tiny scattered liver lesions measuring up to 0.5 cm, too small to accurately characterize, for which 1 month stability has been demonstrated. Attention is recommended on follow-up MRI abdomen without and with IV contrast in 3-6 months. 5. Bilateral adrenal adenomas. These results were called by telephone at the time of interpretation on 03/08/2016 at 2:31 pm to Dr. Jonathon Bellows , who verbally acknowledged these results. Electronically Signed   By: Ilona Sorrel M.D.   On: 03/08/2016 14:37   US Abdomen Limited Ruq  Result Date: 03/07/2016 CLINICAL DATA:  Pancreatitis ; jaundice; abdominal pain. EXAM: US ABDOMEN LIMITED - RIGHT UPPER QUADRANT COMPARISON:  03/07/2016 CT scan FINDINGS: Gallbladder: Gallbladder wall thickening at 5 mm, with trace pericholecystic fluid. Sludge in the gallbladder, without well-defined stones. Sonographic Murphy sign indeterminate due to pain medication. Common bile duct: Diameter: 9 mm Liver: Subtle intrahepatic biliary dilatation. A dominant 3.8 by 3.6 by 2.1 cm lesion peripherally in the right hepatic lobe is hyperechoic and compatible with the hemangioma shown previously at this location. The smaller hemangioma in the caudate lobe was not well seen on today's ultrasound. IMPRESSION: 1. Mild intrahepatic biliary dilatation with the dilated CBD at 9 mm, this is abnormally dilated, and could be the result of obstruction in the vicinity of the pancreatic head. Appearance on prior CT could possibly reflect a mass in the pancreatic head and  adjacent body, versus focal pancreatitis. Even assuming the patient has signs and symptoms of focal pancreatitis, careful follow up to exclude pancreatic malignancy will be important. 2. There is gallbladder wall thickening at 5 mm with trace pericholecystic fluid, potentially from inflammation, correlate clinically in assessing for acute cholecystitis. Sludge noted in the gallbladder. Electronically Signed   By: Van Clines M.D.   On: 03/07/2016 12:46    Labs:  CBC:  Recent Labs  03/07/16 0714 03/08/16 0452  WBC 9.1 4.7  HGB 16.5* 14.1  HCT 48.2* 41.9  PLT 162 118*    COAGS:  Recent Labs  03/08/16 0452  INR 1.19  APTT 38*    BMP:  Recent Labs  03/07/16 0714 03/08/16 0452 03/09/16 0454 03/09/16 1527  NA 137 141 141 141  K 2.5* 4.2 4.2 3.8  CL 100* 108 112* 111  CO2 27 27 22 22   GLUCOSE 141* 94 81 156*  BUN 18 9 11 12   CALCIUM 9.6 8.8* 8.8* 8.9  CREATININE 0.99 0.39* 0.33* 0.34*  GFRNONAA >60 >60 >60 >60  GFRAA >60 >60 >60 >60    LIVER FUNCTION TESTS:  Recent Labs  03/07/16 0714 03/08/16 0452 03/09/16 1527  BILITOT 4.0* 5.1*  5.5* 6.2*  AST 78* 111* 161*  ALT 35 53 87*  ALKPHOS 89 96 172*  PROT 7.0 5.7* 5.6*  ALBUMIN 3.7 2.8* 2.8*    TUMOR MARKERS:  Recent Labs  03/07/16 1620  CEA 3.4  CA199 223*    Assessment and Plan:  For PTC with biliary drainage today due to failure to cannulate CBD by ERCP.  Risks and benefits discussed with the patient including, but not limited to bleeding, infection which may lead to sepsis or even death and damage to adjacent structures. All of the patient's questions were answered, patient is agreeable to proceed. Consent signed and in chart.  Thank you for this interesting consult.  I greatly enjoyed meeting Michelle Conway and look forward to participating in their care.  A copy of this report was sent to the requesting provider on this date.  Electronically SignedAletta Edouard T 03/10/2016, 10:21  AM   I spent a total of 20 Minutes in face to face in clinical consultation, greater than 50% of which was counseling/coordinating care for biliary obstruction and pancreatic mass.

## 2016-03-10 NOTE — Progress Notes (Signed)
Met patient in radiology after PTC drain. Fluid sent for cytology. Discussed with Dr Vianne Bulls and Dr Dahlia Byes  and the patient . She wishes to transfer care to [facility].   She will need  1. Adequate pain control 2. Check cbc to ensure not dropping  3. Maintaining PO intake 4. Internalizing the drain, EUS or CT guided biopsy of the mass at [facility] and subsequent oncology follow up    Dr Jonathon Bellows  Gastroenterology/Hepatology Pager: [phone]

## 2016-03-10 NOTE — Progress Notes (Signed)
Pancreatic mass For today PTC  AVSS  PE NAD Abd: soft, mild diffuse tenderness no peritonitis  A/P Pancreatic mass w obst jaundice.  D/w pt in detail about situation and Recommend transfer to tertiary care She prefers [facility] No need for immediate surgical intervention May attempt EUS for tissue dx but I would defer this to surg onc [facility] surgery. 25 spent in this encounter with the majority of time used in Counseling the pt.

## 2016-03-10 NOTE — Procedures (Signed)
Interventional Radiology Procedure Note  Procedure:  Percutaneous internal/external biliary drainage with cholangiogram  Complications: None  Estimated Blood Loss: < 10 mL  Findings:  After access of peripheral bile duct in inferior right lobe of liver, cholangiogram shows high grade obstruction of mid/distal CBD.  Obstruction able to be crossed allowing placement of 10 Fr int/ext drain to level of duodenal lumen with side holes extending up into right ducts.  Bile sample sent for cytology.  Catheter connected to gravity drainage bag.  Venetia Night. Kathlene Cote, M.D Pager:  [phone]

## 2016-03-11 LAB — COMPREHENSIVE METABOLIC PANEL
ALK PHOS: 184 U/L — AB (ref 38–126)
ALT: 96 U/L — AB (ref 14–54)
ANION GAP: 9 (ref 5–15)
AST: 89 U/L — ABNORMAL HIGH (ref 15–41)
Albumin: 2.9 g/dL — ABNORMAL LOW (ref 3.5–5.0)
BILIRUBIN TOTAL: 3.9 mg/dL — AB (ref 0.3–1.2)
BUN: 11 mg/dL (ref 6–20)
CALCIUM: 9.1 mg/dL (ref 8.9–10.3)
CO2: 23 mmol/L (ref 22–32)
CREATININE: 0.42 mg/dL — AB (ref 0.44–1.00)
Chloride: 109 mmol/L (ref 101–111)
GFR calc non Af Amer: >60 mL/min (ref >60)
GLUCOSE: 107 mg/dL — AB (ref 65–99)
Potassium: 3.7 mmol/L (ref 3.5–5.1)
Sodium: 141 mmol/L (ref 135–145)
TOTAL PROTEIN: 6 g/dL — AB (ref 6.5–8.1)

## 2016-03-11 MED ORDER — OXYCODONE-ACETAMINOPHEN 5-325 MG PO TABS: 1.0000 | ORAL_TABLET | Freq: Four times a day (QID) | ORAL | 0 refills | Status: AC | PRN

## 2016-03-11 NOTE — Progress Notes (Signed)
IV was removed. Discharge instructions, follow-up appointments, and prescriptions were provided to the pt. All questions answered. The pt voiced understanding of calling Monday to make her follow up appointment with her primary doctor and the GI doctor.  The pt was taken downstairs via wheelchair by NT.

## 2016-03-11 NOTE — Progress Notes (Signed)
Gastroenterology Progress Note    Michelle Conway 55 y.o. [birth year]   Subjective: Denies abdominal pain/N/V. Tolerating soft foods. S/P external biliary drain yesterday. Husband at bedside.  Objective: Vital signs in last 24 hours: Vitals:   03/10/16 2033 03/11/16 0446  BP: (!) 143/79 131/77  Pulse: 71 75  Resp: (!) 24 20  Temp: 98.3 F (36.8 C) 98.3 F (36.8 C)   Dark bile in external drain tubing and bag  Physical Exam: Gen: lethargic, jaundice, no acute distress HEENT: +scleral icterus CV: RRR Chest: CTA B Abd: soft, nontender, nondistended, +BS Ext: no edema  Lab Results:  Recent Labs  03/09/16 1527 03/11/16 1019  NA 141 141  K 3.8 3.7  CL 111 109  CO2 22 23  GLUCOSE 156* 107*  BUN 12 11  CREATININE 0.34* 0.42*  CALCIUM 8.9 9.1    Recent Labs  03/09/16 1527 03/11/16 1019  AST 161* 89*  ALT 87* 96*  ALKPHOS 172* 184*  BILITOT 6.2* 3.9*  PROT 5.6* 6.0*  ALBUMIN 2.8* 2.9*    Recent Labs  03/10/16 1241  WBC 4.9  HGB 14.7  HCT 44.6  MCV 100.0  PLT 110*   No results for input(s): LABPROT, INR in the last 72 hours.    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 Needs EUS guided or CT guided biopsy of pancreatic mass and oncology f/u. Stable clinically to go home and f/u as an outpatient at [facility]. Defer to primary team for arranging outpt f/u with [facility]. Needs repeat LFTs next week. Low fat diet.   Buckner C. 03/11/2016, 11:17 AM

## 2016-03-11 NOTE — Care Management Note (Signed)
Case Management Note  Patient Details  Name: ANNETH BRINKMAN MRN: [id number] Date of Birth: 01-26-1961  Subjective/Objective:             Discussed discharge planning with Mrs Jasperson who chose [place] [facility] to be her [facility] provider. A referral was made to [facility] after confirming by telephone call that they could provide [place] to Mrs Heeb' address in [facility], [facility], [facility].       Action/Plan:   Expected Discharge Date:                  Expected Discharge Plan:     In-House Referral:     Discharge planning Services     Post Acute Care Choice:    Choice offered to:     DME Arranged:    DME Agency:     HH Arranged:    HH Agency:     Status of Service:     If discussed at [employer] of Stay Meetings, dates discussed:    Additional Comments:  Stephnie Parlier A, RN 03/11/2016, 10:36 AM

## 2016-03-11 NOTE — Progress Notes (Signed)
[facility] [place] NAME: Michelle Conway    MR#:  [id number]  DATE OF BIRTH:  April 29, 1960  SUBJECTIVE:  Symptoms better today. Less abdominal pain.  Biliary drain is draining working. REVIEW OF SYSTEMS:   Review of Systems  Constitutional: Positive for weight loss. Negative for chills and fever.  HENT: Negative for ear discharge, ear pain and nosebleeds.   Eyes: Negative for blurred vision, pain and discharge.  Respiratory: Negative for sputum production, shortness of breath, wheezing and stridor.   Cardiovascular: Negative for chest pain, palpitations, orthopnea and PND.  Gastrointestinal: Positive for abdominal pain. Negative for diarrhea, nausea and vomiting.  Genitourinary: Negative for frequency and urgency.  Musculoskeletal: Negative for back pain and joint pain.  Neurological: Positive for weakness. Negative for sensory change, speech change and focal weakness.  Psychiatric/Behavioral: Negative for depression and hallucinations. The patient is not nervous/anxious.    Tolerating Diet:npo Tolerating PT: not needed  DRUG ALLERGIES:   Allergies  Allergen Reactions  . No Known Allergies     VITALS:  Blood pressure 131/77, pulse 75, temperature 98.3 F (36.8 C), temperature source Oral, resp. rate 20, height 5' 3"  (1.6 m), weight 78.1 kg (172 lb 1.6 oz), SpO2 98 %.  PHYSICAL EXAMINATION:   Physical Exam  GENERAL:  55 y.o.-year-old patient lying in the bed with no acute distress. Appears malnourished EYES: Pupils equal, round, reactive to light and accommodation.++ scleral icterus. Extraocular muscles intact.  HEENT: Head atraumatic, normocephalic. Oropharynx and nasopharynx clear.  NECK:  Supple, no jugular venous distention. No thyroid enlargement, no tenderness.  LUNGS: Normal breath sounds bilaterally, no wheezing, rales, rhonchi. No use of accessory muscles of respiration.  CARDIOVASCULAR: S1, S2 normal. No murmurs,  rubs, or gallops.  ABDOMEN: Soft, nontender, nondistended. Bowel sounds present. No organomegaly or mass. Biliary drain present in the right upper quadrant.  EXTREMITIES: No cyanosis, clubbing or edema b/l.    NEUROLOGIC: Cranial nerves II through XII are intact. No focal Motor or sensory deficits b/l.   PSYCHIATRIC:  patient is alert and oriented x 3.  SKIN: No obvious rash, lesion, or ulcer.   LABORATORY PANEL:  CBC  Recent Labs Lab 03/10/16 1241  WBC 4.9  HGB 14.7  HCT 44.6  PLT 110*    Chemistries   Recent Labs Lab 03/08/16 0452  03/11/16 1019  NA 141  < > 141  K 4.2  < > 3.7  CL 108  < > 109  CO2 27  < > 23  GLUCOSE 94  < > 107*  BUN 9  < > 11  CREATININE 0.39*  < > 0.42*  CALCIUM 8.8*  < > 9.1  MG 1.8  --   --   AST 111*  < > 89*  ALT 53  < > 96*  ALKPHOS 96  < > 184*  BILITOT 5.1*  5.5*  < > 3.9*  < > = values in this interval not displayed. Cardiac Enzymes No results for input(s): TROPONINI in the last 168 hours. 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 EXAM: IR CHOLANGIOGRAM PERCUTANEOUS TRANSHEPATIC PERCUTANEOUS BILIARY DRAINAGE WITH PLACEMENT OF INTERNAL/EXTERNAL BILIARY DRAINAGE CATHETER MEDICATIONS: 3.375 g IV Zosyn; The antibiotic was administered within an appropriate time frame prior to the initiation of the procedure. ANESTHESIA/SEDATION: Moderate (  conscious) sedation was employed during this procedure. A total of Versed 4.5 mg and Fentanyl 100 mcg was administered intravenously. Moderate Sedation Time: 30 minutes. The patient's level of consciousness and vital signs were monitored continuously by radiology nursing throughout the procedure under my direct supervision. FLUOROSCOPY TIME:  Fluoroscopy Time: 5 minutes and 42 seconds. 69 mGy.  COMPLICATIONS: None immediate. PROCEDURE: Informed written consent was obtained from the patient after a thorough discussion of the procedural risks, benefits and alternatives. All questions were addressed. Maximal Sterile Barrier Technique was utilized including caps, mask, sterile gowns, sterile gloves, sterile drape, hand hygiene and skin antiseptic. A timeout was performed prior to the initiation of the procedure. Ultrasound was used to localize the liver. Overlying skin was prepped with chlorhexidine. Local anesthesia was provided with 1% lidocaine. Under direct ultrasound guidance, a peripheral bile duct in the inferior right lobe of the liver was accessed with a 21 gauge needle. Contrast injection was performed under fluoroscopy and cholangiogram images obtained. A guidewire was then advanced into the duct. A transitional dilator was then advanced into the biliary tree. Additional contrast injection was then performed at the level of central bile ducts and additional cholangiography performed. A 5 French catheter was then advanced over a guidewire into the common bile duct. The catheter was negotiated through the level of common bile duct obstruction over a hydrophilic guidewire. The catheter was advanced into the duodenal lumen beyond the ampulla and contrast injection performed to confirm intraluminal positioning within the duodenum. Over a guidewire, the percutaneous biliary access was dilated and a 10 [place] internal/external biliary drainage catheter placed. Distal portion of the catheter was formed at the level of the duodenum. A bile sample was aspirated through the drainage catheter and sent for cytologic analysis. Final catheter positioning was confirmed with a fluoroscopic spot image after injection of contrast material. The catheter was secured at the skin with a Prolene retention suture, StatLock device and attached to a gravity drainage bag. FINDINGS: Ultrasound demonstrates dilated intrahepatic  bile ducts, predominantly affecting the central biliary tree. Peripheral bile ducts are mild to moderately dilated and an inferior right lobe duct was able to be accessed with a needle allowing percutaneous access. Cholangiogram demonstrates high-grade obstruction of the mid and distal common bile duct. This was able to be traversed with a catheter and guidewire. There was some difficulty negotiating the guidewire through the ampulla and there is also suspicion of tumor involvement of the ampulla. This was also confirmed at the time of ERCP yesterday. After placement of the biliary drain, the distal portion was formed in the duodenal lumen and sideholes extend up through the common bile duct and into right lobe intrahepatic ducts. There is good drainage of bile following the procedure. IMPRESSION: Percutaneous biliary drainage procedure with placement of 10 French internal/ external biliary drainage catheter through the level of common bile duct and ampullary biliary obstruction to the duodenal lumen. Cholangiogram confirms high-grade obstruction of the mid to distal common bile duct. A sample of bile aspirated from the common bile duct was sent for cytologic analysis. The catheter was placed to gravity bag drainage. Electronically Signed   By: Aletta Edouard M.D.   On: 03/10/2016 12:10   ASSESSMENT AND PLAN:  Michelle Conway  is a 55 y.o. 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 Status post PTC with external biliary drain. Patient feels well, less abdominal pain. Discharging her home with home nurse to take care of the biliary drain issues. Patient needs GI follow-[facility] at [facility], meanwhile patient and patient's husband advised to call Dr. Jonathon Bellows to arrange appropriate follow-up her and he understand this but.     today, had percutaneous internal/external biliary drainage. Noted to have high-grade obstruction in mid, distal CBD obstruction is able to pass with the drain to the level of duodenum. Biliary sample sent for cytology.   * Pancreatic mass with obstructive jaundice status post biliary drain PTC. Patient needs a heart patient peripheral at [facility] for possible lower tissue diagnosis and possible Whipple procedure, further treatment options for pancreatic cancer. home health for biliary drain care. Discharge home today with po pain meds.  * Hypertension   Blood pressure is stable, monitor.  * Hypokalemia'; corrected    * Smoking   Tobacco cessation counseling was done for 4 minutes by me offered nicotine patch.  D/w dr Vicente Males and pt's husband Discharge home today.. As the husband, patient, answered all the questions  Case discussed with Care Management/Social Worker. Management plans discussed with the patient, family and they are in agreement.  CODE STATUS: FULL  DVT Prophylaxis: SCD  TOTAL TIME TAKING CARE OF THIS PATIENT: 30 minutes.  >50% time spent on counselling and coordination of care  .  Note: This dictation was prepared with Dragon dictation along with smaller phrase technology. Any transcriptional errors that result from this process are unintentional.  Sollie Vultaggio M.D on 03/11/2016 at 1:25 PM  Between 7am to 6pm - Pager - [phone]  After 6pm go to [email or web address] - password [email or web address] [facility]  Office  [phone]  CC: Primary care physician; Cletis Athens,  MD

## 2016-03-14 ENCOUNTER — Other Ambulatory Visit: Payer: Self-pay | Admitting: Pathology

## 2016-03-14 LAB — CYTOLOGY - NON PAP

## 2016-03-14 NOTE — Discharge Summary (Signed)
Michelle Conway, is a 55 y.o. female  DOB 1960-08-08  MRN [id number].  Admission date:  03/07/2016  Admitting Physician  Vaughan Basta, MD  Discharge Date:  12/092017   Primary MD  Cletis Athens, MD  Recommendations for primary care physician for things to follow:   Follow-up with primary doctor in 1 week Follow up with gastroenterology in [place] otherwise follow up with Dr. Jonathon Bellows in 2-3 days to see if he can follow on [facility].same d/w patient.   Admission Diagnosis  Biliary sludge [K83.8] Hypokalemia [E87.6] Pancreatitis [K85.90] Lower abdominal pain [R10.30]   Discharge Diagnosis  Biliary sludge [K83.8] Hypokalemia [E87.6] Pancreatitis [K85.90] Lower abdominal pain [R10.30]    Principal Problem:   Acute pancreatitis Active Problems:   Abdominal pain   Nausea   Weight loss   Elevated bilirubin   Protein-calorie malnutrition, severe   Pancreas neoplasm   Disease of biliary tract      Past Medical History:  Diagnosis Date  . Allergy   . Arthritis   . Bronchial pneumonia 11/2015   patient currently being treated  . Headache   . Hyperlipidemia   . Hypertension     Past Surgical History:  Procedure Laterality Date  . ABDOMINAL HYSTERECTOMY    . ERCP N/A 03/09/2016   Procedure: ENDOSCOPIC RETROGRADE CHOLANGIOPANCREATOGRAPHY (ERCP);  Surgeon: Lucilla Lame, MD;  Location: [facility] ENDOSCOPY;  Service: Endoscopy;  Laterality: N/A;  . IR GENERIC HISTORICAL  03/10/2016   IR PERCUTANEOUS TRANSHEPATIC CHOLANGIOGRAM 03/10/2016 Aletta Edouard, MD [facility]-INTERV RAD  . JOINT REPLACEMENT Bilateral    knees  . KNEE SURGERY Right   . KNEE SURGERY Left   . TONSILLECTOMY Bilateral   . TONSILLECTOMY AND ADENOIDECTOMY N/A        History of present illness and  Hospital Course:     Kindly see H&P for history of  present illness and admission details, please review complete Labs, Consult reports and Test reports for all details in brief  Michelle Conway  is a 55 y.o. female with a known history of Arthritis, bronchial pneumonia, hyperlipidemia, hypertension- started having generalized abdominal pain for last 2 and half months, pain is constant but fluctuating, associated with nausea and almost 50 pound weight loss in last 2 months. Her primary care physician did initial workups but was not able to find any significant reasons so she was referred to a surgical clinic- where she had appointment next week. Her pain was getting worse and she could not tolerate it so came to emergency room today. She was noted to have elevated bilirubin and lipase level and on CT and abdominal ultrasound she was noted to have possibly pancreatic head mass or obstructed CBD, she is admitted to hospitalist service.    HPI  from the history and physical done on the day of admission    Hospital Course  #1 abdominal pain, weight loss, nausea, vomiting secondary to   obstructive jaundice with 9 mm dilatation of CBD is with acute pancreatitis with mass in her pancreas. Seen by gastroenterology, initially started on IV hydration and kept her nothing by mouth, surgery, gastroenterology followed the patient. Possibility of gallstone pancreatitis/growth in the pancreas leading to biliary blockage entertained. Patient seen by surgery, did not think patient needs any surgical intervention for jaundice. Patient had MRI or MRCP. MRCP showed 3.2 cm pancreatic neck mass with abdominal cough of the common bile duct, main pancreatic duct palpation. 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 Patient also found to have portocaval lymph nodes,. #2 obstructive jaundice with elevated AST up to 160/ALT 87, total bilirubin up to 6.2. Patient ERCP could not be done because of blood  covering the pancreatic mass and unable to put in a stent. Patient seen by interventional radiology, she had ercutaneous internal/external biliary drainage on December 8. After that patient to liver functions, jaundice improved. Biliary sample was sent for cytology. Catheter was connected to gravity drainage back. Abdominal pain improved, nausea improved patient discharged home in stable condition but regarding her pancreatic mass patient needs workup /[facility]  [facility],\ .and follow up with [facility] or due to an outpatient with hepatologist. P Told that the patient, patient's husband to follow-up with the GI doctor Jonathon Bellows .  Discharge her home with home health. Patient needs EUS/CT-guided biopsy of pancreatic mass and oncology follow. Follow Up with  GI in 1 week for repeat liver function testing  Dis charge Condition: stable   Follow UP  Follow-up Information    Jonathon Bellows, MD Follow up.   Specialty:  Surgery Contact information: [address] [place] [address] [phone]             Discharge Instructions  and  Discharge Medications    Discharge Instructions    Face-to-face encounter (required for Medicare/Medicaid patients)    Complete by:  As directed    I [facility] certify that this patient is under my care and that I, or a nurse practitioner or physician's assistant working with me, had a face-to-face encounter that meets the physician face-to-face encounter requirements with this patient on 03/11/2016. The encounter with the patient was in whole, or in part for the following medical condition(s) which is the primary reason for home health care obstructie jaundice S/p biliary drain:external;;needs home health care   The encounter with the patient was in whole, or in part, for the following medical condition, which is the primary reason for home health care:  whole   I certify that, based on my findings, the following services are medically necessary  home health services:  Nursing   Reason for Medically Necessary Home Health Services:  Skilled Nursing- Post-Surgical Wound Assessment and Care   My clinical findings support the need for the above services:  Unable to leave home safely without assistance and/or assistive device   Further, I certify that my clinical findings support that this patient is homebound due to:  Open/draining pressure/stasis ulcer   Home Health    Complete by:  As directed    To provide the following care/treatments:  RN       Medication List    STOP taking these medications   HYDROcodone-acetaminophen 5-325 MG tablet Commonly known as:  NORCO/VICODIN   simvastatin 40 MG tablet Commonly known as:  ZOCOR     TAKE these medications   benzonatate 100 MG capsule Commonly known as:  TESSALON Take by mouth 3 (three) times daily.   cetirizine 10 MG tablet Commonly known as:  ZYRTEC Take 10 mg by mouth daily as needed. Reported on 08/02/2015   FLUoxetine 20 MG tablet Commonly known as:  PROZAC Take 20 mg by mouth daily.   gabapentin 300 MG capsule Commonly known as:  NEURONTIN Limit 1 tablet by mouth per day or twice a day if tolerated What changed:  how much to take  how to take this  when to take this  additional instructions   lisinopril-hydrochlorothiazide 20-25 MG tablet Commonly known  as:  PRINZIDE,ZESTORETIC Take 1 tablet by mouth daily.   orphenadrine 100 MG tablet Commonly known as:  NORFLEX Limit 1 tablet by mouth per day or twice per day if tolerated   oxyCODONE-acetaminophen 5-325 MG tablet Commonly known as:  PERCOCET/ROXICET Take 1 tablet by mouth every 6 (six) hours as needed for moderate pain.   RABEprazole 20 MG tablet Commonly known as:  ACIPHEX Take 20 mg by mouth daily.   topiramate 25 MG tablet Commonly known as:  TOPAMAX Take 25 mg by mouth 2 (two) times daily as needed.         Diet and Activity recommendation: See Discharge Instructions above   Consults  obtained -  Gi,surgery,Interventional radiology   Major procedures and Radiology Reports - PLEASE review detailed and final reports for all details, in brief -     Nm Hepatobiliary Liver Func  Result Date: 03/08/2016 CLINICAL DATA:  Evaluate for cholecystitis. EXAM: NUCLEAR MEDICINE HEPATOBILIARY IMAGING TECHNIQUE: Sequential images of the abdomen were obtained out to 60 minutes following intravenous administration of radiopharmaceutical. RADIOPHARMACEUTICALS:  7.4 mCi Tc-36m  Choletec IV COMPARISON:  None. FINDINGS: Prompt uptake and biliary excretion of activity by the liver is seen. No gallbladder activity is visualized. No bile activity within the small bowel, consistent with obstruction of the common bile duct versus hepatocyte function. IMPRESSION: 1. Imaging findings are compatible with a high-grade common bile duct obstruction and correspond with findings of an obstructing lesion arising from the pancreas as seen on MRI from today. Electronically Signed   By: Kerby Moors M.D.   On: 03/08/2016 14:24   Ct Abdomen Pelvis W Contrast  Result Date: 03/07/2016 CLINICAL DATA:  Abdomen pain for 6 weeks. 50 pound weight loss in 10 weeks. EXAM: CT ABDOMEN AND PELVIS WITH CONTRAST TECHNIQUE: Multidetector CT imaging of the abdomen and pelvis was performed using the standard protocol following bolus administration of intravenous contrast. CONTRAST:  15mL ISOVUE-300 IOPAMIDOL (ISOVUE-300) INJECTION 61% COMPARISON:  MRI of the abdomen February 04, 2016 FINDINGS: Lower chest: There is minimal dependent atelectasis of the posterior lung bases. No focal pneumonia is identified. The heart size is normal. Hepatobiliary: There is a low-density lesion in the lateral posterior segment right lobe liver unchanged compared to prior MRI where it was described as a hemangioma. There is mild intrahepatic biliary ductal dilatation. The common bile duct measures 10 mm. The gallbladder demonstrates a thickened wall with  surrounding fluid. Pancreas: There is patchy low density with stranding surrounding the pancreatic head. Spleen: Normal in size without focal abnormality. Adrenals/Urinary Tract: Low-density masses are identified in bilateral adrenal glands consistent with previous MRI noted bilateral adrenal adenomas. Left kidney cysts is identified unchanged. The right kidney is normal. Stomach/Bowel: Stomach is within normal limits. Appendix appears normal. No evidence of bowel wall thickening, distention, or inflammatory changes. Vascular/Lymphatic: Aortic atherosclerosis. No enlarged abdominal or pelvic lymph nodes. Reproductive: Status post hysterectomy. No adnexal masses. Other: There is small umbilical herniation of mesenteric fat. Musculoskeletal: Degenerative joint changes of the spine are noted. IMPRESSION: Low-density identified in the pancreatic head with surrounding stranding ; the findings could be due to pancreatitis but low-density/partial cystic pancreatic mass is not excluded. There is gallbladder wall thickening with pericholecystic fluid suspicious for cholecystitis. Intra and extrahepatic biliary ductal dilatation identified. Low-density lesion in the lateral posterior segment right lobe liver unchanged compared to prior MRI where it was described as a hemangioma. Low-density masses in bilateral adrenal glands unchanged compared to previous recent MRI where the  lesions were described as bilateral adrenal adenomas. Electronically Signed   By: Abelardo Diesel M.D.   On: 03/07/2016 11:32   Mr Abdomen Mrcp Moise Boring Contast  Result Date: 03/08/2016 CLINICAL DATA:  55 year old female admitted with worsening abdominal pain, mildly elevated lipase, elevated serum bilirubin levels and new biliary ductal dilatation and ill-defined low-attenuation throughout the pancreatic head on CT and ultrasound. EXAM: MRI ABDOMEN WITHOUT AND WITH CONTRAST (INCLUDING MRCP) TECHNIQUE: Multiplanar multisequence MR imaging of the abdomen  was performed both before and after the administration of intravenous contrast. Heavily T2-weighted images of the biliary and pancreatic ducts were obtained, and three-dimensional MRCP images were rendered by post processing. CONTRAST:  81mL MULTIHANCE GADOBENATE DIMEGLUMINE 529 MG/ML IV SOLN COMPARISON:  03/07/2016 right upper quadrant ultrasound and CT abdomen/ pelvis. 02/04/2016 MRI abdomen. FINDINGS: Motion degraded scan. Lower chest: Clear lung bases. Hepatobiliary: Normal liver size and configuration. No hepatic steatosis. There are three similar-appearing T2 hyperintense liver masses each demonstrating progressive discontinuous peripheral nodular enhancement consistent with benign hemangiomas, measuring 4.1 cm in the subcapsular inferior right liver lobe (series 18/ image 33), 0.8 cm in the medial segment 5 right liver lobe (series 18/ image 23) and 0.9 cm in the central segment 8 right liver lobe (series 18/ image 11), all stable in size since 02/04/2016 MRI. There are four additional scattered tiny liver lesions measuring up to the 0.5 cm (series 18/images 7, 9, 11 and 21), too small to accurately characterize, not appreciably changed in size since 02/04/2016. No new liver lesions. Mildly distended gallbladder. Sludge is seen layering in the gallbladder. Moderate diffuse gallbladder wall thickening. No gallstones. Mild diffuse intrahepatic biliary ductal dilatation. Common bile duct diameter 9 mm, mildly dilated. There is abrupt cut off of the common bile duct at the level of the pancreatic neck (series 3/ image 13). Pancreas: There is an infiltrative 3.2 x 2.7 x 3.1 cm pancreatic neck mass (series 23/image 7), which demonstrates restricted diffusion, mild T2 hyperintensity and heterogeneous hypoenhancement. There is associated mild dilatation of the main pancreatic duct in the pancreatic body and tail (3 mm diameter). There is atrophy of the pancreatic body and tail. Spleen: Normal size. No mass.  Adrenals/Urinary Tract: There are bilateral adrenal adenomas measuring 1.4 cm on the right and 6.4 cm on the left, both demonstrating significant loss of signal intensity on out of phase chemical shift imaging. No hydronephrosis. Simple 7.9 cm upper left renal cyst. No suspicious renal masses. Stomach/Bowel: Grossly normal stomach. Visualized small and large bowel is normal caliber, with no bowel wall thickening. Vascular/Lymphatic: Normal caliber abdominal aorta. Patent hepatic, renal and splenic veins. There is encasement and high-grade narrowing of the portosplenic venous confluence by the pancreatic neck mass (series 22/image 24 and series 16/ image 24). The mass appears to abut the common hepatic artery. The superior mesenteric artery is uninvolved by the mass. Enlarged 1.4 cm portacaval node (series 20/ image 26). No additional pathologically enlarged abdominal lymph nodes. Other: No abdominal ascites or focal fluid collection. Musculoskeletal: No aggressive appearing focal osseous lesions. IMPRESSION: 1. Infiltrative hypoenhancing 3.2 cm pancreatic neck mass with associated abrupt cut off of the common bile duct, mild main pancreatic duct dilation and atrophy of the pancreatic body and tail. 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 Encasement and high-grade narrowing of the portosplenic venous confluence by the mass. 2. Dilated common bile duct (9 mm diameter). Mild diffuse intrahepatic biliary ductal dilatation. Distended sludge filled thick walled gallbladder. No cholelithiasis. 3. Portacaval lymphadenopathy, suspicious  for nodal metastasis. 4. No definite liver metastases. Three liver hemangiomas. Four additional tiny scattered liver lesions measuring up to 0.5 cm, too small to accurately characterize, for which 1 month stability has been demonstrated. Attention is recommended on follow-up MRI abdomen without and with IV contrast in 3-6 months. 5. Bilateral adrenal adenomas. These  results were called by telephone at the time of interpretation on 03/08/2016 at 2:31 pm to Dr. Jonathon Bellows , who verbally acknowledged these results. Electronically Signed   By: Ilona Sorrel M.D.   On: 03/08/2016 14:37   Ir Percutaneous Transhepatic Cholangiogram  Result Date: 03/10/2016 INDICATION: Pancreatic and ampullary mass causing biliary obstruction. 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 EXAM: IR CHOLANGIOGRAM PERCUTANEOUS TRANSHEPATIC PERCUTANEOUS BILIARY DRAINAGE WITH PLACEMENT OF INTERNAL/EXTERNAL BILIARY DRAINAGE CATHETER MEDICATIONS: 3.375 g IV Zosyn; The antibiotic was administered within an appropriate time frame prior to the initiation of the procedure. ANESTHESIA/SEDATION: Moderate (conscious) sedation was employed during this procedure. A total of Versed 4.5 mg and Fentanyl 100 mcg was administered intravenously. Moderate Sedation Time: 30 minutes. The patient's level of consciousness and vital signs were monitored continuously by radiology nursing throughout the procedure under my direct supervision. FLUOROSCOPY TIME:  Fluoroscopy Time: 5 minutes and 42 seconds. 69 mGy. COMPLICATIONS: None immediate. PROCEDURE: Informed written consent was obtained from the patient after a thorough discussion of the procedural risks, benefits and alternatives. All questions were addressed. Maximal Sterile Barrier Technique was utilized including caps, mask, sterile gowns, sterile gloves, sterile drape, hand hygiene and skin antiseptic. A timeout was performed prior to the initiation of the procedure. Ultrasound was used to localize the liver. Overlying skin was prepped with chlorhexidine. Local anesthesia was provided with 1% lidocaine. Under direct ultrasound guidance, a peripheral bile duct in the inferior right lobe of the liver was accessed with a 21 gauge needle. Contrast injection was performed under fluoroscopy and cholangiogram images  obtained. A guidewire was then advanced into the duct. A transitional dilator was then advanced into the biliary tree. Additional contrast injection was then performed at the level of central bile ducts and additional cholangiography performed. A 5 French catheter was then advanced over a guidewire into the common bile duct. The catheter was negotiated through the level of common bile duct obstruction over a hydrophilic guidewire. The catheter was advanced into the duodenal lumen beyond the ampulla and contrast injection performed to confirm intraluminal positioning within the duodenum. Over a guidewire, the percutaneous biliary access was dilated and a 10 [place] internal/external biliary drainage catheter placed. Distal portion of the catheter was formed at the level of the duodenum. A bile sample was aspirated through the drainage catheter and sent for cytologic analysis. Final catheter positioning was confirmed with a fluoroscopic spot image after injection of contrast material. The catheter was secured at the skin with a Prolene retention suture, StatLock device and attached to a gravity drainage bag. FINDINGS: Ultrasound demonstrates dilated intrahepatic bile ducts, predominantly affecting the central biliary tree. Peripheral bile ducts are mild to moderately dilated and an inferior right lobe duct was able to be accessed with a needle allowing percutaneous access. Cholangiogram demonstrates high-grade obstruction of the mid and distal common bile duct. This was able to be traversed with a catheter and guidewire. There was some difficulty negotiating the guidewire through the ampulla and there is also suspicion of tumor involvement of the ampulla. This was also confirmed at the time of ERCP  yesterday. After placement of the biliary drain, the distal portion was formed in the duodenal lumen and sideholes extend up through the common bile duct and into right lobe intrahepatic ducts. There is good drainage of bile  following the procedure. IMPRESSION: Percutaneous biliary drainage procedure with placement of 10 French internal/ external biliary drainage catheter through the level of common bile duct and ampullary biliary obstruction to the duodenal lumen. Cholangiogram confirms high-grade obstruction of the mid to distal common bile duct. A sample of bile aspirated from the common bile duct was sent for cytologic analysis. The catheter was placed to gravity bag drainage. Electronically Signed   By: Aletta Edouard M.D.   On: 03/10/2016 12:10   US Abdomen Limited Ruq  Result Date: 03/07/2016 CLINICAL DATA:  Pancreatitis ; jaundice; abdominal pain. EXAM: US ABDOMEN LIMITED - RIGHT UPPER QUADRANT COMPARISON:  03/07/2016 CT scan FINDINGS: Gallbladder: Gallbladder wall thickening at 5 mm, with trace pericholecystic fluid. Sludge in the gallbladder, without well-defined stones. Sonographic Murphy sign indeterminate due to pain medication. Common bile duct: Diameter: 9 mm Liver: Subtle intrahepatic biliary dilatation. A dominant 3.8 by 3.6 by 2.1 cm lesion peripherally in the right hepatic lobe is hyperechoic and compatible with the hemangioma shown previously at this location. The smaller hemangioma in the caudate lobe was not well seen on today's ultrasound. IMPRESSION: 1. Mild intrahepatic biliary dilatation with the dilated CBD at 9 mm, this is abnormally dilated, and could be the result of obstruction in the vicinity of the pancreatic head. Appearance on prior CT could possibly reflect a mass in the pancreatic head and adjacent body, versus focal pancreatitis. Even assuming the patient has signs and symptoms of focal pancreatitis, careful follow up to exclude pancreatic malignancy will be important. 2. There is gallbladder wall thickening at 5 mm with trace pericholecystic fluid, potentially from inflammation, correlate clinically in assessing for acute cholecystitis. Sludge noted in the gallbladder. Electronically Signed    By: Van Clines M.D.   On: 03/07/2016 12:46    Micro Results    Recent Results (from the past 240 hour(s))  Urine culture     Status: Abnormal   Collection Time: 03/07/16  7:14 AM  Result Value Ref Range Status   Specimen Description URINE, RANDOM  Final   Special Requests NONE  Final   Culture MULTIPLE SPECIES PRESENT, SUGGEST RECOLLECTION (A)  Final   Report Status 03/08/2016 FINAL  Final       Today   Subjective:   Michelle Conway today has no headache,no chest abdominal pain,no new weakness tingling or numbness, feels much better wants to go home today.  Objective:   Blood pressure 131/77, pulse 75, temperature 98.3 F (36.8 C), temperature source Oral, resp. rate 20, height 5\' 3"  (1.6 m), weight 78.1 kg (172 lb 1.6 oz), SpO2 98 %.  No intake or output data in the 24 hours ending 03/14/16 1400  Exam Awake Alert, Oriented x 3, No new F.N deficits, Normal affect [place].AT,PERRAL Supple Neck,No JVD, No cervical lymphadenopathy appriciated.  Symmetrical Chest wall movement, Good air movement bilaterally, CTAB RRR,No Gallops,Rubs or new Murmurs, No Parasternal Heave +ve B.Sounds, Abd Soft, Non tender, No organomegaly appriciated, No rebound -guarding or rigidity. No Cyanosis, Clubbing or edema, No new Rash or bruise  Data Review   CBC w Diff:  Lab Results  Component Value Date   WBC 4.9 03/10/2016   HGB 14.7 03/10/2016   HCT 44.6 03/10/2016   PLT 110 (L) 03/10/2016  CMP:  Lab Results  Component Value Date   NA 141 03/11/2016   K 3.7 03/11/2016   CL 109 03/11/2016   CO2 23 03/11/2016   BUN 11 03/11/2016   CREATININE 0.42 (L) 03/11/2016   PROT 6.0 (L) 03/11/2016   ALBUMIN 2.9 (L) 03/11/2016   BILITOT 3.9 (H) 03/11/2016   ALKPHOS 184 (H) 03/11/2016   AST 89 (H) 03/11/2016   ALT 96 (H) 03/11/2016  .   Total Time in preparing paper work, data evaluation and todays exam - 55 minutes  Michelle Conway M.D on 03/11/2016 at 2:00 PM    Note: This  dictation was prepared with Dragon dictation along with smaller phrase technology. Any transcriptional errors that result from this process are unintentional.

## 2016-03-15 ENCOUNTER — Other Ambulatory Visit
Admission: RE | Admit: 2016-03-15 | Discharge: 2016-03-15 | Disposition: A | Discharge status: Home / Self Care | Payer: Managed Care, Other (non HMO) | Source: Ambulatory Visit | Attending: Internal Medicine | Admitting: Internal Medicine

## 2016-03-15 DIAGNOSIS — K7689 Other specified diseases of liver: Secondary | ICD-10-CM | POA: Diagnosis not present

## 2016-03-15 LAB — HEPATIC FUNCTION PANEL
ALT: 39 U/L (ref 14–54)
AST: 29 U/L (ref 15–41)
Albumin: 3.4 g/dL — ABNORMAL LOW (ref 3.5–5.0)
Alkaline Phosphatase: 125 U/L (ref 38–126)
Bilirubin, Direct: 1.1 mg/dL — ABNORMAL HIGH (ref 0.1–0.5)
Indirect Bilirubin: 1.6 mg/dL — ABNORMAL HIGH (ref 0.3–0.9)
Total Bilirubin: 2.7 mg/dL — ABNORMAL HIGH (ref 0.3–1.2)
Total Protein: 7 g/dL (ref 6.5–8.1)

## 2016-03-29 ENCOUNTER — Other Ambulatory Visit: Payer: Self-pay | Admitting: Pain Medicine

## 2016-06-01 DEATH — deceased

## 2017-08-02 IMAGING — MR MR ABDOMEN WO/W CM
7 of 17 series · 19 of 48 positions shown · IV contrast (multihance)
Comparison: Ultrasound on 01/21/2016

CLINICAL DATA: Liver masses seen on recent ultrasound. Abdominal
pain and weight loss over past 8 weeks.

EXAM:
MRI ABDOMEN WITHOUT AND WITH CONTRAST
TECHNIQUE: Multiplanar multisequence MR imaging of the abdomen was performed
both before and after the administration of intravenous contrast.
CONTRAST:  15mL MULTIHANCE GADOBENATE DIMEGLUMINE 529 MG/ML IV SOLN

[Series 3: T2 · coronal · 8.0mm · 1.56mm/px · 2 of 25 slices shown]
[im 1/25]
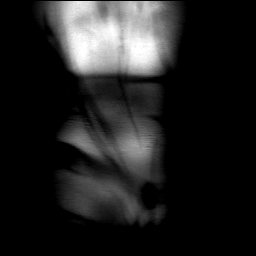
[im 25/25]
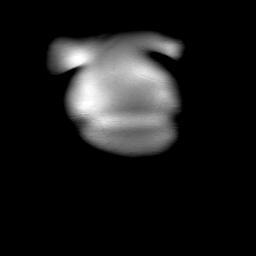

[Series 4: T2 fat-sat · axial · 7.0mm · 0.74mm/px · z∈[-128,+141]mm · 2 of 33 slices shown]
[im 1/33]
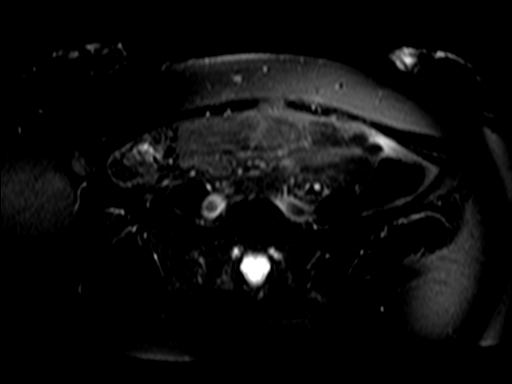
[im 33/33]
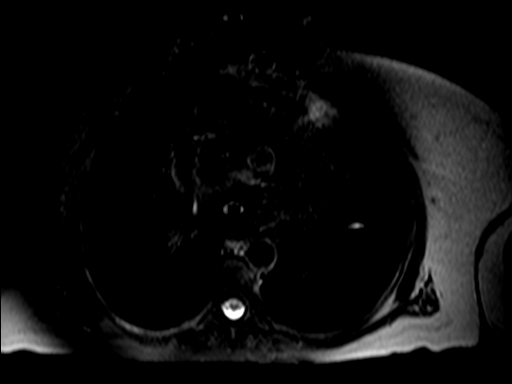

[Series 5: axial in-out of · axial · 7.0mm · 0.74mm/px · z∈[-128,+141]mm · 3 of 66 slices shown]
[im 1/66]
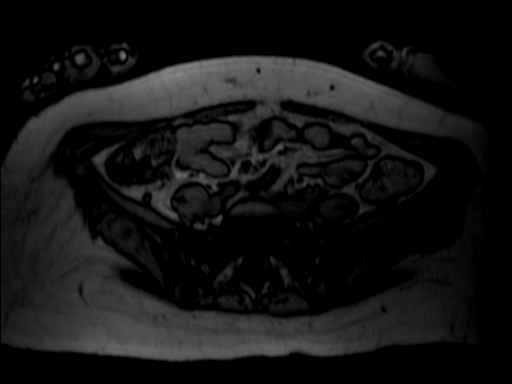
[im 33/66]
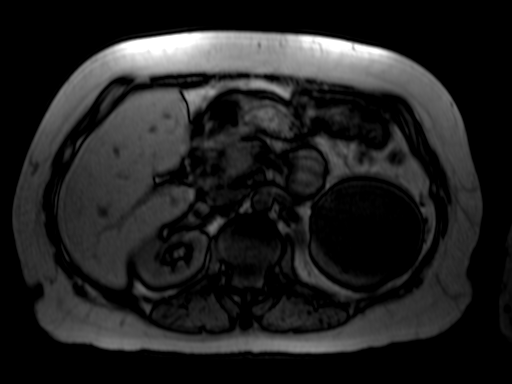
[im 66/66]
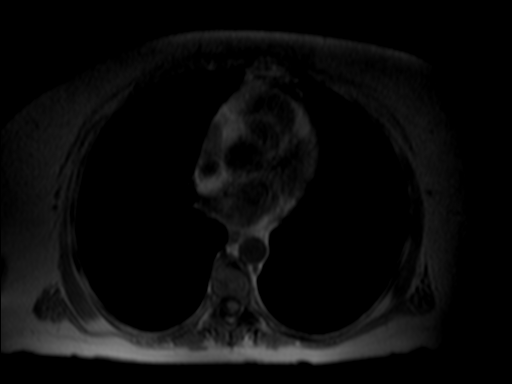

[Series 6: axial true fisp-- · axial · 4.0mm · 0.74mm/px · z∈[-122,+134]mm · 3 of 65 slices shown]
[im 1/65]
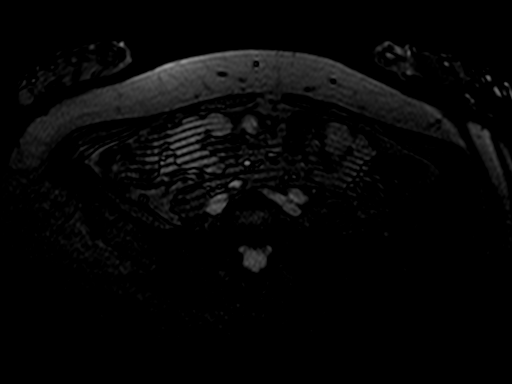
[im 33/65]
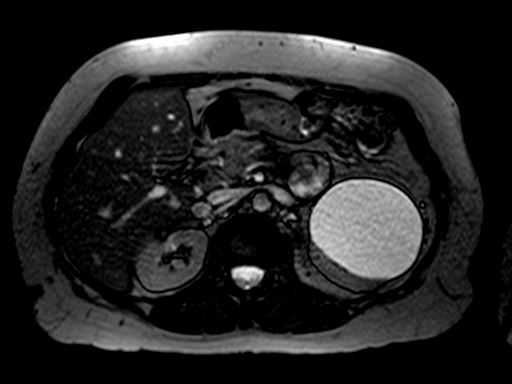
[im 65/65]
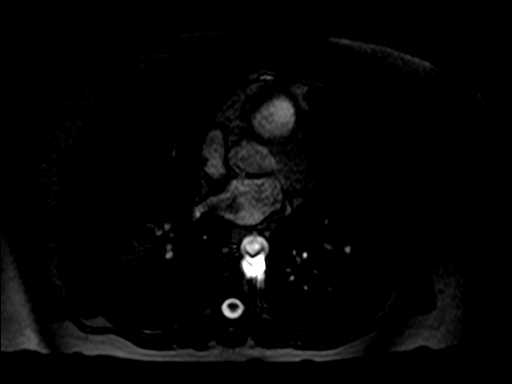

[Series 7: DWI · axial · 6.0mm · 2.97mm/px · z∈[-152,+165]mm · 6 of 135 slices shown]
[im 1/135]
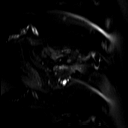
[im 27/135]
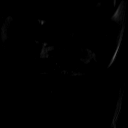
[im 54/135]
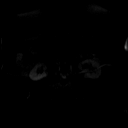
[im 81/135]
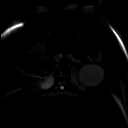
[im 108/135]
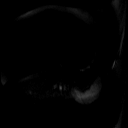
[im 135/135]
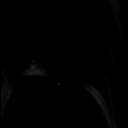

[Series 8: axial dwi_adc · axial · 6.0mm · 2.97mm/px · z∈[-152,+165]mm · 2 of 45 slices shown]
[im 1/45]
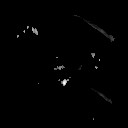
[im 45/45]
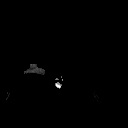

[Series 17: T2 post-contrast · axial · 7.0mm · 1.48mm/px · 1 of 33 slices shown]
[im 1/33]
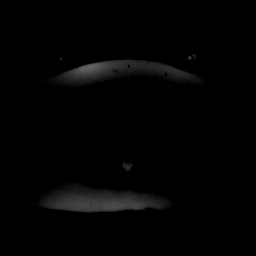

[19 of 48 positions shown; findings below may reference images not displayed]

FINDINGS: Lower chest: No acute findings.

Hepatobiliary: A benign hemangioma is seen in the inferior right
hepatic lobe measuring 1.8 x 4.0 cm. Another benign hemangioma is
seen in segment 6 of the right lobe medially measuring 1.6 cm on
image 39/18. Other scattered tiny sub-cm lesions are seen which are
too small to characterize but most likely represent tiny benign
cysts or hemangiomas.

Gallbladder is unremarkable. No evidence of biliary ductal
dilatation.

Pancreas:  No mass or inflammatory changes.

Spleen:  Within normal limits in size and appearance.

Adrenals/Urinary Tract: A homogeneous T2 hypo intense left adrenal
mass is seen which measures 2.3 x 6.8 cm. A smaller homogeneous T2
hypointense right adrenal mass is seen measuring 1.4 x 2.1 cm. These
both show diffuse signal dropout on opposed phase chemical shift
imaging, consistent with a benign adrenal adenoma. A large simple
cyst is seen in the upper pole of the left kidney measuring 8.6 cm.
No evidence of renal masses or hydronephrosis.

Stomach/Bowel: Visualized portions within the abdomen are
unremarkable.

Vascular/Lymphatic: No pathologically enlarged lymph nodes
identified. No abdominal aortic aneurysm demonstrated.

Other:  None.

Musculoskeletal:  No suspicious bone lesions identified.
IMPRESSION: Several benign hepatic hemangiomas, largest in the right lobe
measuring 4 cm. Other sub-cm hepatic lesions are too small to
characterize, but most likely represent tiny benign hemangiomas or
cysts. No evidence of hepatic malignancy.

Benign bilateral adrenal adenomas and left renal cyst incidentally
noted.
